# Patient Record
Sex: Male | Born: 1970 | Race: White | Hispanic: No | Marital: Married | Smoking: Never smoker
Health system: Southern US, Community
[De-identification: ages and names within clinical notes are randomized; demographics above are authoritative.]

## PROBLEM LIST (undated history)

## (undated) DIAGNOSIS — I10 Essential (primary) hypertension: Secondary | ICD-10-CM

## (undated) DIAGNOSIS — K219 Gastro-esophageal reflux disease without esophagitis: Secondary | ICD-10-CM

## (undated) HISTORY — PX: VASECTOMY: SHX75

---

## 2002-11-11 ENCOUNTER — Encounter: Admission: RE | Admit: 2002-11-11 | Discharge: 2002-11-11 | Payer: Self-pay | Admitting: Internal Medicine

## 2002-11-11 ENCOUNTER — Encounter: Payer: Self-pay | Admitting: Internal Medicine

## 2012-06-05 ENCOUNTER — Encounter: Payer: Self-pay | Admitting: Sports Medicine

## 2012-06-05 ENCOUNTER — Ambulatory Visit (INDEPENDENT_AMBULATORY_CARE_PROVIDER_SITE_OTHER): Payer: BC Managed Care – PPO | Admitting: Sports Medicine

## 2012-06-05 VITALS — BP 141/96 | HR 72 | Ht 77.0 in | Wt 265.0 lb

## 2012-06-05 DIAGNOSIS — M7710 Lateral epicondylitis, unspecified elbow: Secondary | ICD-10-CM | POA: Insufficient documentation

## 2012-06-05 DIAGNOSIS — M25529 Pain in unspecified elbow: Secondary | ICD-10-CM

## 2012-06-05 DIAGNOSIS — M25521 Pain in right elbow: Secondary | ICD-10-CM | POA: Insufficient documentation

## 2012-06-05 MED ORDER — NITROGLYCERIN 0.2 MG/HR TD PT24: MEDICATED_PATCH | TRANSDERMAL | Status: DC

## 2012-06-05 NOTE — Progress Notes (Signed)
  Subjective:    Patient ID: Jermaine Weber, male    DOB: 07-16-1970, 42 y.o.   MRN: [id number]  HPI  1. Right elbow pain. 42 yo male notes some left elbow pain began 3 months ago. He states had just finished building a patio deck and lifting 40-80 lb stones successfully without any problems. Soon after this, he was getting into his car to turn the ignition and felt his right elbow lock. He then forcefully turned the right elbow to start the car, and felt a sharp pain in the lateral elbow. Has noticed the pain intermittently since that time, present with supination of the arm and trying to grip objects.   He denies any swelling, bruising, weakness or history of elbow problems. He is using NSAIDs and resting with mild improvement.   Review of Systems See HPI otherwise negative.  reports that he has never smoked. He has never used smokeless tobacco.     Objective:   Physical Exam  Constitutional: He is oriented to person, place, and time. He appears well-developed. No distress.  HENT:  Head: Normocephalic and atraumatic.  Musculoskeletal: He exhibits no edema and no tenderness.  No significant right elbow bruising or effusion. Mild TTP at lateral epicondyle. ROM is intact. Has some mild pain with resisted supination and hand flexion. Pain with book grip test. 5/5 strength with supination and forearm flexion.   Neurological: He is alert and oriented to person, place, and time.   MSK [place] of right elbow: lateral epicondyle intact, there is some extrusion of the radial meniscus. Tendons appear to have hypoechoic change on transverse scan and slight hypoechoic change on longitudinal scan. There is also abnormal Doppler flow with neo-vessels      Assessment & Plan:

## 2012-06-05 NOTE — Patient Instructions (Addendum)
Radial meniscal injury with tendinopathy. Use nitro patch (1/4 cut patch) on lateral epicondyle daily. Wrist extension and supination exercises. Over slow and back fast.  Ball squeeze exercises.  Try elbow compression sleeve.  Follow up in clinic in 6 weeks.     Nitroglycerin Protocol   Apply 1/4 nitroglycerin patch to affected area daily.  Change position of patch within the affected area every 24 hours.  You may experience a headache during the first 1-2 weeks of using the patch, these should subside.  If you experience headaches after beginning nitroglycerin patch treatment, you may take your preferred over the counter pain reliever.  Another side effect of the nitroglycerin patch is skin irritation or rash related to patch adhesive.  Please notify our office if you develop more severe headaches or rash, and stop the patch.  Tendon healing with nitroglycerin patch may require 12 to 24 weeks depending on the extent of injury.  Men should not use if taking Viagra, Cialis, or Levitra.   Do not use if you have migraines or rosacea.

## 2012-06-05 NOTE — Assessment & Plan Note (Addendum)
US shows a radial meniscal injury which seems to fit with history of locking and popping sensation with supination. Will treat with some ROM and strengthening exercises for forearm. Compression sleeve recommended. Topical nitro patch at lateral epicondyle daily. Follow up in 6 weeks.

## 2012-06-05 NOTE — Assessment & Plan Note (Signed)
Tennis elbow exercise series  Nitroglycerin protocol for tendinopathy  Recheck 6 weeks

## 2012-07-18 ENCOUNTER — Encounter: Payer: Self-pay | Admitting: Sports Medicine

## 2012-07-18 ENCOUNTER — Ambulatory Visit (INDEPENDENT_AMBULATORY_CARE_PROVIDER_SITE_OTHER): Payer: BC Managed Care – PPO | Admitting: Sports Medicine

## 2012-07-18 VITALS — BP 123/84 | HR 75 | Ht 77.0 in | Wt 265.0 lb

## 2012-07-18 DIAGNOSIS — M7710 Lateral epicondylitis, unspecified elbow: Secondary | ICD-10-CM

## 2012-07-18 NOTE — Progress Notes (Signed)
Patient ID: Jermaine Weber, male   DOB: Apr 11, 1970, 42 y.o.   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  He remembers turning car key that was stuck and had sudden pain At that time was working on patio stones that were heavy  Now 75% less pain Uses compression when active He felt improvement fairly quickly once starting nitroglycerin  Prior to that he had had approximately 2 months of symptoms  Physical examination No acute distress Full range of motion right elbow  Minimal tenderness to palpation No pain with resisted wrist or finger extension Book test causes only slight pain  Musculoskeletal ultrasound Hypoechoic changes at the elbow or much less There is some slight hypoechoic change seen primarily on transverse view No abnormal calcifications No tears Neo-vessel activity noted before has now resolved

## 2012-07-18 NOTE — Assessment & Plan Note (Signed)
He is much improved  Continue exercises preferably daily for the next 6 weeks Continue nitroglycerin  Use compression sleeve primarily for sports  If this is 90% better by 6 weeks he will not need to return

## 2016-06-23 ENCOUNTER — Ambulatory Visit (INDEPENDENT_AMBULATORY_CARE_PROVIDER_SITE_OTHER): Payer: Managed Care, Other (non HMO) | Admitting: Sports Medicine

## 2016-06-23 ENCOUNTER — Ambulatory Visit: Payer: Self-pay

## 2016-06-23 ENCOUNTER — Encounter: Payer: Self-pay | Admitting: Sports Medicine

## 2016-06-23 VITALS — BP 138/88 | Ht 77.0 in | Wt 270.0 lb

## 2016-06-23 DIAGNOSIS — M25512 Pain in left shoulder: Secondary | ICD-10-CM

## 2016-06-24 DIAGNOSIS — M25512 Pain in left shoulder: Secondary | ICD-10-CM | POA: Insufficient documentation

## 2016-06-24 NOTE — Progress Notes (Signed)
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 Palpation is normal with no tenderness over AC joint or bicipital groove. ROM is full in all planes but painful on back scratch Rotator cuff strength normal throughout. No signs of impingement with negative Neer and Hawkin's tests empty can sign - weak on left Speeds and Yergason's tests normal. Possible labral pathology noted with Obrien's,  negative clunk and good stability. Normal scapular function observed. No painful arc and no drop arm sign. No apprehension sign  Ultrasound of left shoulder  Supraspinatus tendon normal Infraspinatus and teres minor normal Subscapularis normal No impingement dynamically Normal biceps tendon Normal AC joint  Impression is normal ultrasound of left shoulder  Ultrasound and interpretation by Sibyl ParrKarl B. Darrick PennaFields, MD

## 2016-06-24 NOTE — Assessment & Plan Note (Signed)
Start some HEP for Rotator cuff Can try Ntg if desired - helped him with elbow  Suspect this will heal with time  Reck if still painful in 6 wks

## 2016-11-15 ENCOUNTER — Other Ambulatory Visit: Payer: Self-pay | Admitting: Internal Medicine

## 2016-11-15 DIAGNOSIS — E041 Nontoxic single thyroid nodule: Secondary | ICD-10-CM | POA: Diagnosis not present

## 2016-11-15 DIAGNOSIS — E049 Nontoxic goiter, unspecified: Secondary | ICD-10-CM

## 2016-11-23 ENCOUNTER — Ambulatory Visit
Admission: RE | Admit: 2016-11-23 | Discharge: 2016-11-23 | Disposition: A | Discharge status: Home / Self Care | Payer: Managed Care, Other (non HMO) | Source: Ambulatory Visit | Attending: Internal Medicine | Admitting: Internal Medicine

## 2016-11-23 DIAGNOSIS — E041 Nontoxic single thyroid nodule: Secondary | ICD-10-CM | POA: Diagnosis not present

## 2016-11-23 DIAGNOSIS — E049 Nontoxic goiter, unspecified: Secondary | ICD-10-CM

## 2016-11-29 ENCOUNTER — Other Ambulatory Visit: Payer: Self-pay | Admitting: Internal Medicine

## 2016-11-29 DIAGNOSIS — E041 Nontoxic single thyroid nodule: Secondary | ICD-10-CM

## 2016-11-30 ENCOUNTER — Other Ambulatory Visit (HOSPITAL_COMMUNITY)
Admission: RE | Admit: 2016-11-30 | Discharge: 2016-11-30 | Disposition: A | Discharge status: Home / Self Care | Payer: 59 | Source: Ambulatory Visit | Attending: Interventional Radiology | Admitting: Interventional Radiology

## 2016-11-30 ENCOUNTER — Ambulatory Visit
Admission: RE | Admit: 2016-11-30 | Discharge: 2016-11-30 | Disposition: A | Discharge status: Home / Self Care | Payer: 59 | Source: Ambulatory Visit | Attending: Internal Medicine | Admitting: Internal Medicine

## 2016-11-30 DIAGNOSIS — E041 Nontoxic single thyroid nodule: Secondary | ICD-10-CM | POA: Insufficient documentation

## 2017-02-03 DIAGNOSIS — E041 Nontoxic single thyroid nodule: Secondary | ICD-10-CM | POA: Diagnosis not present

## 2017-02-03 DIAGNOSIS — R7301 Impaired fasting glucose: Secondary | ICD-10-CM | POA: Diagnosis not present

## 2017-02-03 DIAGNOSIS — I119 Hypertensive heart disease without heart failure: Secondary | ICD-10-CM | POA: Diagnosis not present

## 2017-04-28 DIAGNOSIS — E041 Nontoxic single thyroid nodule: Secondary | ICD-10-CM | POA: Diagnosis not present

## 2017-04-28 DIAGNOSIS — I119 Hypertensive heart disease without heart failure: Secondary | ICD-10-CM | POA: Diagnosis not present

## 2017-08-15 DIAGNOSIS — R7301 Impaired fasting glucose: Secondary | ICD-10-CM | POA: Diagnosis not present

## 2017-08-15 DIAGNOSIS — Z6832 Body mass index (BMI) 32.0-32.9, adult: Secondary | ICD-10-CM | POA: Diagnosis not present

## 2017-08-15 DIAGNOSIS — I119 Hypertensive heart disease without heart failure: Secondary | ICD-10-CM | POA: Diagnosis not present

## 2017-08-15 DIAGNOSIS — Z0000 Encounter for general adult medical examination without abnormal findings: Secondary | ICD-10-CM | POA: Diagnosis not present

## 2017-08-16 DIAGNOSIS — Z0000 Encounter for general adult medical examination without abnormal findings: Secondary | ICD-10-CM | POA: Diagnosis not present

## 2017-08-16 DIAGNOSIS — I119 Hypertensive heart disease without heart failure: Secondary | ICD-10-CM | POA: Diagnosis not present

## 2017-08-16 DIAGNOSIS — R7301 Impaired fasting glucose: Secondary | ICD-10-CM | POA: Diagnosis not present

## 2017-08-16 DIAGNOSIS — Z6832 Body mass index (BMI) 32.0-32.9, adult: Secondary | ICD-10-CM | POA: Diagnosis not present

## 2017-08-16 DIAGNOSIS — E041 Nontoxic single thyroid nodule: Secondary | ICD-10-CM | POA: Diagnosis not present

## 2018-01-26 DIAGNOSIS — D2360 Other benign neoplasm of skin of unspecified upper limb, including shoulder: Secondary | ICD-10-CM | POA: Diagnosis not present

## 2018-01-26 DIAGNOSIS — I119 Hypertensive heart disease without heart failure: Secondary | ICD-10-CM | POA: Diagnosis not present

## 2018-01-26 DIAGNOSIS — E041 Nontoxic single thyroid nodule: Secondary | ICD-10-CM | POA: Diagnosis not present

## 2018-01-26 DIAGNOSIS — R7301 Impaired fasting glucose: Secondary | ICD-10-CM | POA: Diagnosis not present

## 2018-04-11 DIAGNOSIS — Q828 Other specified congenital malformations of skin: Secondary | ICD-10-CM | POA: Diagnosis not present

## 2018-04-11 DIAGNOSIS — D2261 Melanocytic nevi of right upper limb, including shoulder: Secondary | ICD-10-CM | POA: Diagnosis not present

## 2018-04-12 ENCOUNTER — Other Ambulatory Visit: Payer: Self-pay | Admitting: Orthotics

## 2018-04-16 ENCOUNTER — Ambulatory Visit (INDEPENDENT_AMBULATORY_CARE_PROVIDER_SITE_OTHER): Payer: 59 | Admitting: Orthotics

## 2018-04-16 ENCOUNTER — Other Ambulatory Visit: Payer: Self-pay

## 2018-04-16 VITALS — Temp 98.6°F

## 2018-04-16 DIAGNOSIS — Q6652 Congenital pes planus, left foot: Secondary | ICD-10-CM | POA: Diagnosis not present

## 2018-04-16 DIAGNOSIS — Q6651 Congenital pes planus, right foot: Secondary | ICD-10-CM | POA: Diagnosis not present

## 2018-04-16 DIAGNOSIS — Q6650 Congenital pes planus, unspecified foot: Secondary | ICD-10-CM

## 2018-04-16 NOTE — Progress Notes (Signed)

## 2018-05-08 ENCOUNTER — Other Ambulatory Visit: Payer: Self-pay

## 2018-05-08 ENCOUNTER — Ambulatory Visit: Payer: 59 | Admitting: Orthotics

## 2018-05-08 VITALS — Temp 97.1°F

## 2018-05-08 DIAGNOSIS — Q6650 Congenital pes planus, unspecified foot: Secondary | ICD-10-CM

## 2018-05-08 NOTE — Progress Notes (Signed)
Patient came in today to pick up custom made foot orthotics.  The goals were accomplished and the patient reported no dissatisfaction with said orthotics.  Patient was advised of breakin period and how to report any issues. 

## 2018-07-27 ENCOUNTER — Telehealth: Payer: Self-pay | Admitting: Podiatry

## 2018-07-27 DIAGNOSIS — Q6652 Congenital pes planus, left foot: Secondary | ICD-10-CM

## 2018-07-27 DIAGNOSIS — Q6651 Congenital pes planus, right foot: Secondary | ICD-10-CM

## 2018-07-27 NOTE — Telephone Encounter (Signed)
Left message wanting to get a second pair of orthotics ordered.  I called pt back and he wants to order a second pair of orthotics same as last and is aware of the 199.00 cost. I told pt Liliane Channel was out of the office today and I would have him order them next Monday and  I would call when they come in.

## 2018-08-07 IMAGING — US US THYROID BIOPSY
1 series · 12 of 12 positions shown · non-contrast
Comparison: 11/23/2016

MEDICATIONS:
Lidocaine 1% subcutaneous

COMPLICATIONS:
None immediate.

INDICATION: Indeterminate thyroid nodule

EXAM:
ULTRASOUND GUIDED FINE NEEDLE ASPIRATION OF INDETERMINATE THYROID
NODULE
TECHNIQUE: Informed written consent was obtained from the patient after a
discussion of the risks, benefits and alternatives to treatment.
Questions regarding the procedure were encouraged and answered. A
timeout was performed prior to the initiation of the procedure.

[Series 1: us thyroid biopsy · 0.08mm/px · 12 acquisitions, 12 frames shown]
[im 1/12]
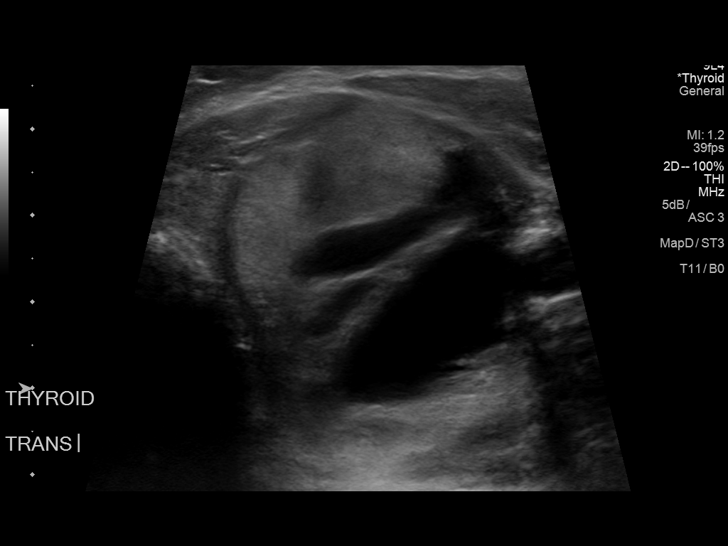
[im 2/12]
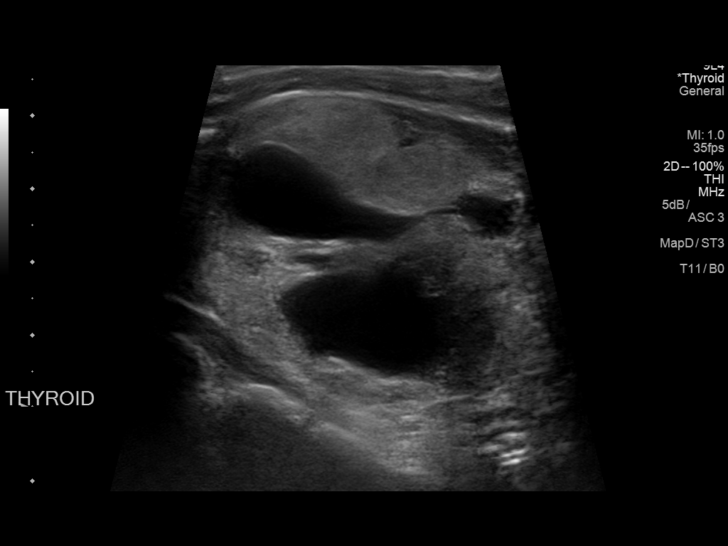
[im 3/12]
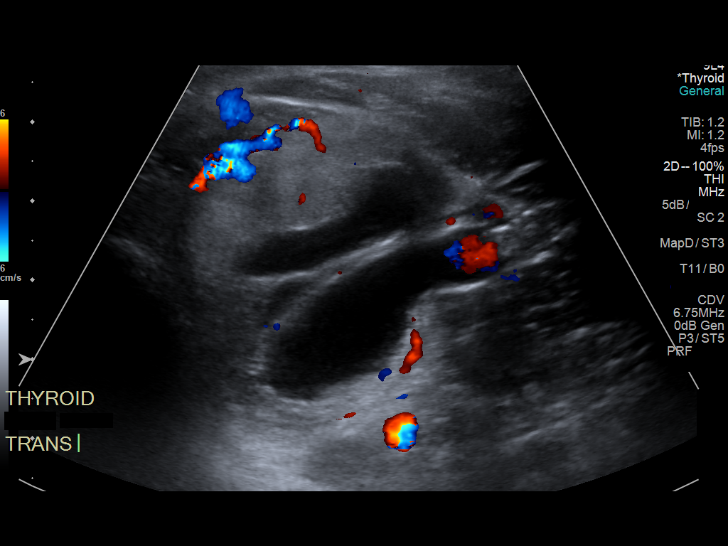
[im 4/12]
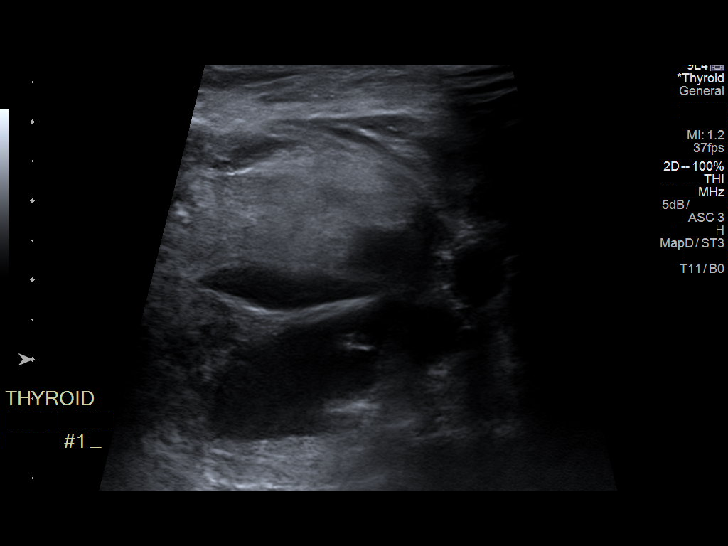
[im 5/12]
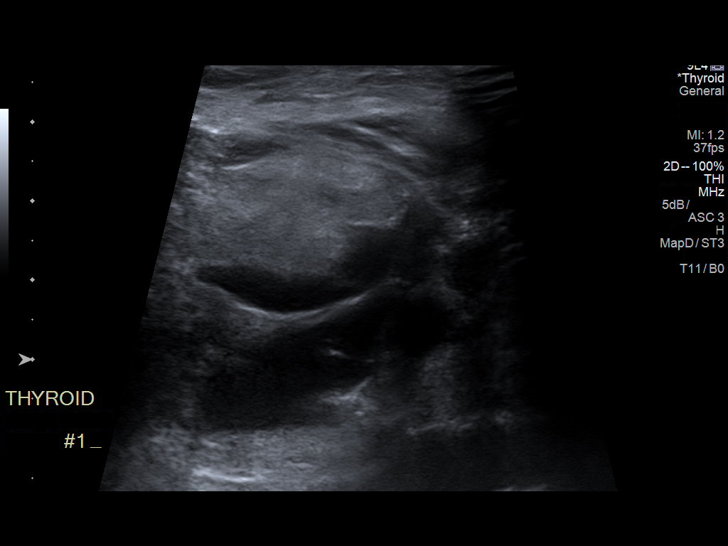
[im 6/12]
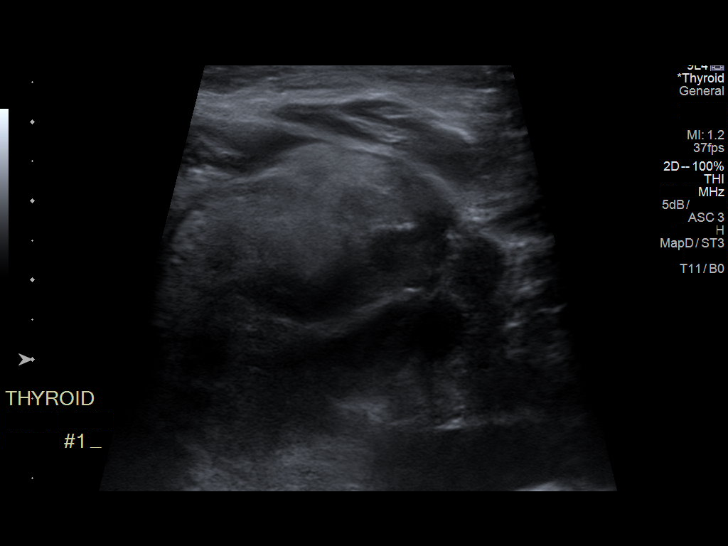
[im 7/12]
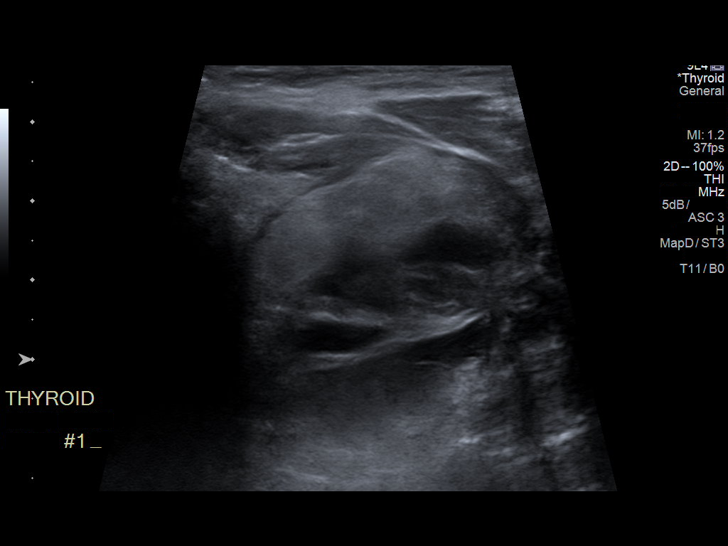
[im 8/12]
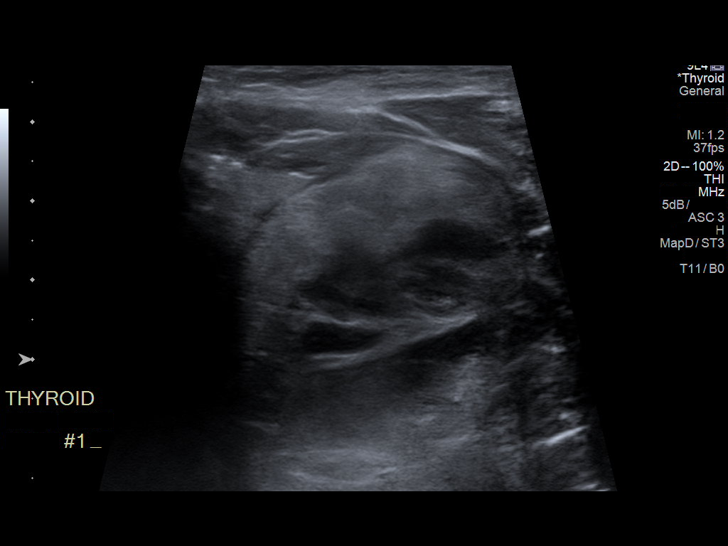
[im 9/12]
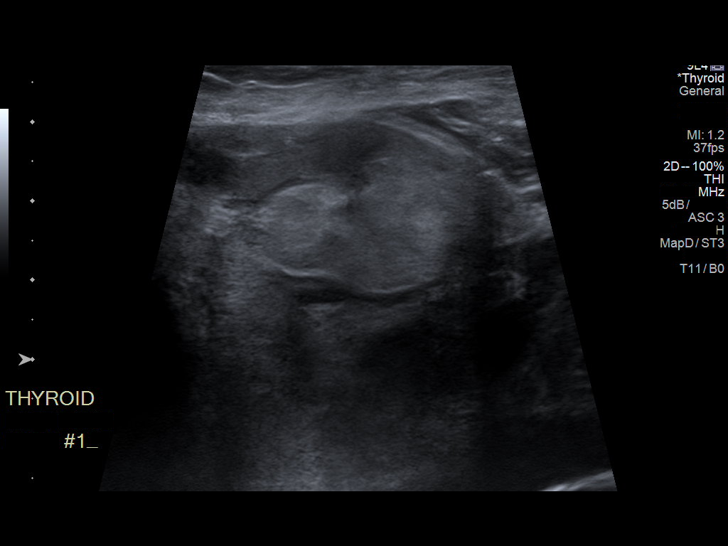
[im 10/12]
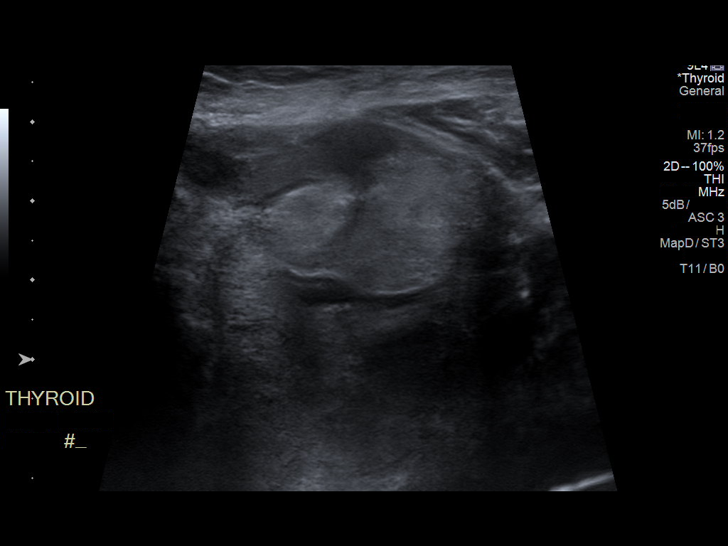
[im 11/12]
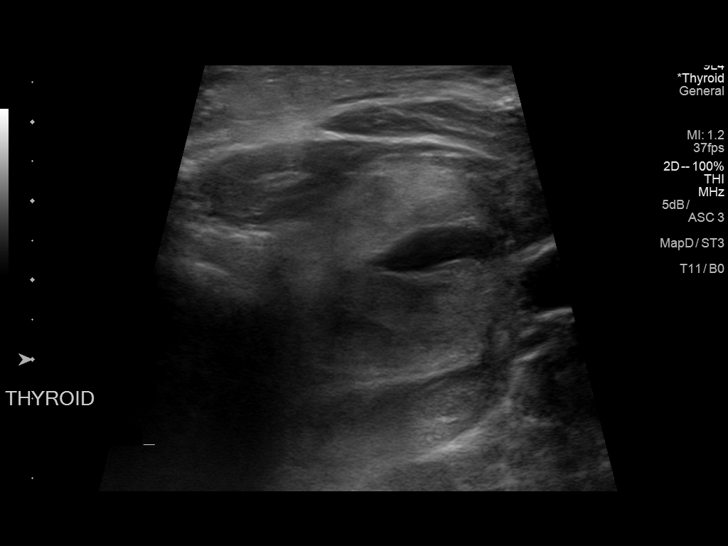
[im 12/12]
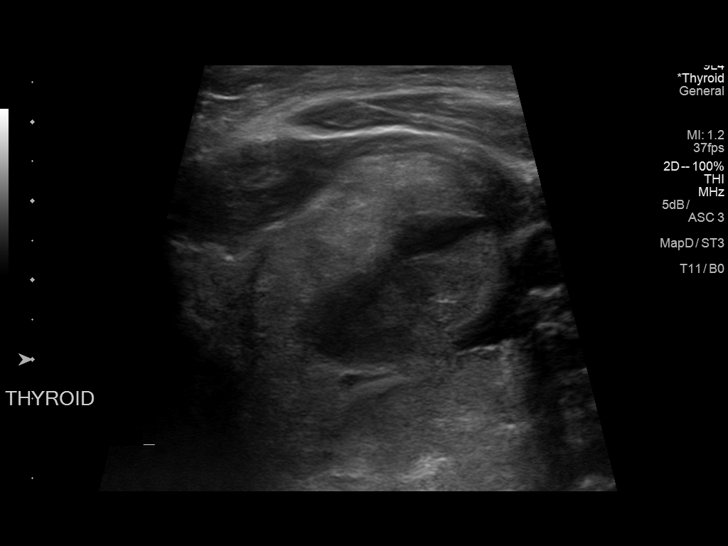

[12 of 12 positions shown; findings below may reference images not displayed]

Pre-procedural ultrasound scanning demonstrated unchanged size and
appearance of the indeterminate nodule within the inferior left
lobe.

The procedure was planned. The neck was prepped in the usual sterile
fashion, and a sterile drape was applied covering the operative
field. A timeout was performed prior to the initiation of the
procedure. Local anesthesia was provided with 1% lidocaine.

Under direct ultrasound guidance, 4 FNA biopsies were performed of
the left lower lobe complex solid/ cystic nodule with 25 gauge
needles, sampling solid regions and aspirating approximately 6 mL of
the cystic component for cytology. Multiple ultrasound images were
saved for procedural documentation purposes. The samples were
prepared and submitted to pathology.

Limited post procedural scanning was negative for hematoma or
additional complication. Dressings were placed. The patient
tolerated the above procedures procedure well without immediate
postprocedural complication.
FINDINGS: Nodule reference number based on prior diagnostic ultrasound: 2

Maximum size: 5.4 cm

Location: Left; Mid

ACR TI-RADS risk category: TR4 (4-6 points)

Reason for biopsy: meets ACR TI-RADS criteria

Ultrasound imaging confirms appropriate placement of the needles
within the thyroid nodule.
IMPRESSION: Technically successful ultrasound guided fine needle aspiration of
mid left 5.4 cm mixed solid/ cystic nodule.

## 2018-08-17 DIAGNOSIS — Z0000 Encounter for general adult medical examination without abnormal findings: Secondary | ICD-10-CM | POA: Diagnosis not present

## 2018-08-17 DIAGNOSIS — R7301 Impaired fasting glucose: Secondary | ICD-10-CM | POA: Diagnosis not present

## 2018-08-17 DIAGNOSIS — I119 Hypertensive heart disease without heart failure: Secondary | ICD-10-CM | POA: Diagnosis not present

## 2018-08-17 DIAGNOSIS — Z6832 Body mass index (BMI) 32.0-32.9, adult: Secondary | ICD-10-CM | POA: Diagnosis not present

## 2018-08-17 DIAGNOSIS — R351 Nocturia: Secondary | ICD-10-CM | POA: Diagnosis not present

## 2018-08-17 DIAGNOSIS — E041 Nontoxic single thyroid nodule: Secondary | ICD-10-CM | POA: Diagnosis not present

## 2018-08-17 DIAGNOSIS — Z125 Encounter for screening for malignant neoplasm of prostate: Secondary | ICD-10-CM | POA: Diagnosis not present

## 2018-08-27 ENCOUNTER — Ambulatory Visit: Payer: 59 | Admitting: Orthotics

## 2018-08-27 ENCOUNTER — Other Ambulatory Visit: Payer: Self-pay

## 2018-08-27 DIAGNOSIS — Q6650 Congenital pes planus, unspecified foot: Secondary | ICD-10-CM

## 2018-08-27 NOTE — Progress Notes (Signed)
Trimmed f/o 

## 2018-09-05 ENCOUNTER — Other Ambulatory Visit: Payer: 59 | Admitting: Orthotics

## 2019-02-15 DIAGNOSIS — R7301 Impaired fasting glucose: Secondary | ICD-10-CM | POA: Diagnosis not present

## 2019-02-15 DIAGNOSIS — I119 Hypertensive heart disease without heart failure: Secondary | ICD-10-CM | POA: Diagnosis not present

## 2019-02-15 DIAGNOSIS — E041 Nontoxic single thyroid nodule: Secondary | ICD-10-CM | POA: Diagnosis not present

## 2019-04-22 DIAGNOSIS — Z20828 Contact with and (suspected) exposure to other viral communicable diseases: Secondary | ICD-10-CM | POA: Diagnosis not present

## 2019-04-30 DIAGNOSIS — Z20828 Contact with and (suspected) exposure to other viral communicable diseases: Secondary | ICD-10-CM | POA: Diagnosis not present

## 2019-04-30 DIAGNOSIS — Z20822 Contact with and (suspected) exposure to covid-19: Secondary | ICD-10-CM | POA: Diagnosis not present

## 2019-05-21 ENCOUNTER — Other Ambulatory Visit: Payer: Self-pay

## 2019-05-21 NOTE — Patient Outreach (Signed)
[facility]) Care Management  05/21/2019  Kern Reap, MD 06-01-1970 [id number]   New referral: Referral source: insurance company  Reviewed KPN: Reviewed Epic  Placed call to patient with no answer. Identified machine. Left a message requesting a call back.  PLAN: will mail outreach letter and call back in 3 days.  Rowe Pavy, RN, BSN, CEN [facility] [employer] [phone]

## 2019-05-24 ENCOUNTER — Other Ambulatory Visit: Payer: Self-pay

## 2019-05-24 NOTE — Patient Outreach (Signed)
[facility]) Care Management  05/24/2019  Kern Reap, MD 24-Jul-1970 [id number]   Hypertension Initiative: Placed call to patient, a male answered and reports patient is not home to call back in the evenings.  PLAN: will attempt an evening outreach in the next 3 business days.  Rowe Pavy, RN, BSN, CEN [facility] [employer] [phone]

## 2019-05-28 ENCOUNTER — Ambulatory Visit: Payer: Self-pay

## 2019-05-29 ENCOUNTER — Other Ambulatory Visit: Payer: Self-pay

## 2019-05-29 NOTE — Patient Outreach (Signed)
[facility]) Care Management  05/29/2019  Kern Reap, MD 01/19/1970 [id number]     Placed 3rd call to patient with no answer. Left a message. Immediate call back, Spoke with patient who identified himself.  Reports he is doing well. Reviewed the reason for my call. 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 Offered to mail a BP cuff and educational materials. Patient respectfully declined.  PLAN: Patient not interested in participating. Has own way of monitoring his BP. Will close case as not interested.  Rowe Pavy, RN, BSN, CEN [facility] [employer] [phone]

## 2019-07-23 DIAGNOSIS — E669 Obesity, unspecified: Secondary | ICD-10-CM | POA: Diagnosis not present

## 2019-07-23 DIAGNOSIS — K219 Gastro-esophageal reflux disease without esophagitis: Secondary | ICD-10-CM | POA: Diagnosis not present

## 2019-07-23 DIAGNOSIS — Z1211 Encounter for screening for malignant neoplasm of colon: Secondary | ICD-10-CM | POA: Diagnosis not present

## 2019-07-23 DIAGNOSIS — Z800 Family history of malignant neoplasm of digestive organs: Secondary | ICD-10-CM | POA: Diagnosis not present

## 2019-08-02 DIAGNOSIS — Z1211 Encounter for screening for malignant neoplasm of colon: Secondary | ICD-10-CM | POA: Diagnosis not present

## 2019-08-02 DIAGNOSIS — Z800 Family history of malignant neoplasm of digestive organs: Secondary | ICD-10-CM | POA: Diagnosis not present

## 2019-08-02 DIAGNOSIS — D122 Benign neoplasm of ascending colon: Secondary | ICD-10-CM | POA: Diagnosis not present

## 2019-08-02 DIAGNOSIS — D125 Benign neoplasm of sigmoid colon: Secondary | ICD-10-CM | POA: Diagnosis not present

## 2019-08-02 DIAGNOSIS — K635 Polyp of colon: Secondary | ICD-10-CM | POA: Diagnosis not present

## 2019-08-09 DIAGNOSIS — R1032 Left lower quadrant pain: Secondary | ICD-10-CM | POA: Diagnosis not present

## 2019-08-09 DIAGNOSIS — Z0000 Encounter for general adult medical examination without abnormal findings: Secondary | ICD-10-CM | POA: Diagnosis not present

## 2019-08-09 DIAGNOSIS — Z1389 Encounter for screening for other disorder: Secondary | ICD-10-CM | POA: Diagnosis not present

## 2019-08-09 DIAGNOSIS — Z6832 Body mass index (BMI) 32.0-32.9, adult: Secondary | ICD-10-CM | POA: Diagnosis not present

## 2019-08-09 DIAGNOSIS — Z1331 Encounter for screening for depression: Secondary | ICD-10-CM | POA: Diagnosis not present

## 2019-08-09 DIAGNOSIS — E7800 Pure hypercholesterolemia, unspecified: Secondary | ICD-10-CM | POA: Diagnosis not present

## 2019-08-09 DIAGNOSIS — E041 Nontoxic single thyroid nodule: Secondary | ICD-10-CM | POA: Diagnosis not present

## 2019-08-09 DIAGNOSIS — R7301 Impaired fasting glucose: Secondary | ICD-10-CM | POA: Diagnosis not present

## 2019-08-09 DIAGNOSIS — I119 Hypertensive heart disease without heart failure: Secondary | ICD-10-CM | POA: Diagnosis not present

## 2021-02-25 ENCOUNTER — Other Ambulatory Visit: Payer: Self-pay | Admitting: Internal Medicine

## 2021-02-25 DIAGNOSIS — E049 Nontoxic goiter, unspecified: Secondary | ICD-10-CM

## 2021-03-01 ENCOUNTER — Ambulatory Visit
Admission: RE | Admit: 2021-03-01 | Discharge: 2021-03-01 | Disposition: A | Discharge status: Home / Self Care | Payer: Managed Care, Other (non HMO) | Source: Ambulatory Visit | Attending: Internal Medicine | Admitting: Internal Medicine

## 2021-03-01 DIAGNOSIS — E049 Nontoxic goiter, unspecified: Secondary | ICD-10-CM

## 2021-03-10 ENCOUNTER — Ambulatory Visit: Payer: Self-pay | Admitting: Surgery

## 2021-03-10 NOTE — H&P (Signed)
REFERRING PHYSICIAN:  Ralene Ok, MD   PROVIDER:  Bri Wakeman Myra Rude, MD   DATE OF ENCOUNTER: 03/10/2021   Subjective    Chief Complaint: New Consultation (Thyroid goiter with compressive symptoms)       History of Present Illness:   Patient is referred by Dr. Ralene Ok for surgical evaluation and management of an enlarging thyroid goiter with compressive symptoms.  Patient is a physician in our community.  He has had an enlarging thyroid gland for at least 4 years.  Initial studies were performed in 2018 including an ultrasound and a fine-needle aspiration biopsy of the dominant nodule in the left thyroid lobe.  Cytopathology was benign, Bethesda category II.  Follow-up ultrasound performed on March 01, 2021 was a normal size right thyroid lobe measuring 5.9 x 1.7 x 1.4 cm and an enlarged left thyroid lobe measuring 8.3 x 5.3 x 6.0 cm.  There is a small subcentimeter nodule on the right.  The dominant nodule on the left measures 5.4 cm in greatest dimension and shows both cystic and solid components.  Patient has noted progressive globus sensation.  He notes some shortness of breath depending on the physical position of the head and neck.  He has had occasional dysphagia.  He presents today to discuss proceeding with thyroidectomy for management of multinodular thyroid goiter with compressive symptoms.  Patient has had no prior head or neck surgery.  He has never been on thyroid medication.  There is a history of multiple family members including his son who take thyroid hormone supplementation for Hashimoto's thyroiditis.  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     Review of Systems: A complete review of systems was obtained from the patient.  I have reviewed this information and discussed as appropriate with the patient.  See HPI as well for other ROS.   Review of Systems  Constitutional: Negative.   HENT:       Globus sensation  Eyes: Negative.    Respiratory: Negative.   Cardiovascular: Negative.   Gastrointestinal:       Mild dysphagia  Genitourinary: Negative.   Musculoskeletal: Negative.   Skin: Negative.   Neurological: Negative.   Endo/Heme/Allergies: Negative.   Psychiatric/Behavioral: Negative.         Medical History: Past Medical History      Past Medical History:  Diagnosis Date   Hypertension     Thyroid disease             Patient Active Problem List  Diagnosis   Multiple thyroid nodules   Enlarged thyroid      Past Surgical History  History reviewed. No pertinent surgical history.      Allergies  No Known Allergies           Current Outpatient Medications on File Prior to Visit  Medication Sig Dispense Refill   lisinopriL-hydrochlorothiazide (ZESTORETIC) 20-12.5 mg tablet TAKE ONE TABLET BY MOUTH ONCE DAILY IN THE MORNING.        No current facility-administered medications on file prior to visit.      Family History       Family History  Problem Relation Age of Onset   Obesity Mother     High blood pressure (Hypertension) Mother     Hyperlipidemia (Elevated cholesterol) Mother     Heart valve disease Mother     Diabetes Mother     Breast cancer Mother     Hyperlipidemia (Elevated cholesterol) Father  High blood pressure (Hypertension) Father     Colon cancer Father          Social History       Tobacco Use  Smoking Status Never  Smokeless Tobacco Never      Social History  Social History        Socioeconomic History   Marital status: Married  Tobacco Use   Smoking status: Never   Smokeless tobacco: Never  Vaping Use   Vaping Use: Never used  Substance and Sexual Activity   Alcohol use: Yes   Drug use: Never        Objective:         Vitals:    03/10/21 0926  BP: (!) 132/90  Pulse: (!) 123  Temp: 37.6 C (99.6 F)  SpO2: 98%  Weight: (!) 115.7 kg (255 lb)  Height: 195.6 cm (6\' 5" )    Body mass index is 30.24 kg/m.   Physical Exam     GENERAL APPEARANCE Development: normal Nutritional status: normal Gross deformities: none   SKIN Rash, lesions, ulcers: none Induration, erythema: none Nodules: none palpable   EYES Conjunctiva and lids: normal Pupils: equal and reactive Iris: normal bilaterally   EARS, NOSE, MOUTH, THROAT External ears: no lesion or deformity External nose: no lesion or deformity Hearing: grossly normal Due to Covid-19 pandemic, patient is wearing a mask.   NECK Symmetric: no Trachea: midline Thyroid: There is a dominant visible palpable mass in the left thyroid lobe measuring approximately 5 to 6 cm in diameter.  It is smooth and nontender.  It is mobile with swallowing.  It may extend slightly beneath the left clavicle.  The right thyroid lobe is without palpable abnormality.  There is no associated lymphadenopathy.   CHEST Respiratory effort: normal Retraction or accessory muscle use: no Breath sounds: normal bilaterally Rales, rhonchi, wheeze: none   CARDIOVASCULAR Auscultation: regular rhythm, normal rate Murmurs: none Pulses: radial pulse 2+ palpable Lower extremity edema: none   MUSCULOSKELETAL Station and gait: normal Digits and nails: no clubbing or cyanosis Muscle strength: grossly normal all extremities Range of motion: grossly normal all extremities Deformity: none   LYMPHATIC Cervical: none palpable Supraclavicular: none palpable   PSYCHIATRIC Oriented to person, place, and time: yes Mood and affect: normal for situation Judgment and insight: appropriate for situation       Assessment and Plan:     Multiple thyroid nodules   Enlarged thyroid     Patient is referred by Dr. Ralene Ok for consideration for total thyroidectomy for management of multinodular thyroid goiter.  Patient is provided with written literature to review at home.   Patient provided with a copy of "The Thyroid Book: Medical and Surgical Treatment of Thyroid Problems", published by  [employer], 16 pages.  Book reviewed and explained to patient during visit today.   Today we discussed the option of left thyroid lobectomy versus total thyroidectomy for management of thyroid goiter.  We discussed the risk and benefits of the procedure including the risk of recurrent laryngeal nerve injury and injury to parathyroid glands.  We discussed the size and location of the surgical incision.  We discussed the hospital stay to be anticipated.  We discussed his postoperative recovery and return to work and activities.  We discussed the need for lifelong thyroid hormone replacement with total thyroidectomy.  After discussion, the patient would prefer to proceed with total thyroidectomy.  We will make arrangements for his procedure at a time convenient for the patient  in the near future.   The risks and benefits of the procedure have been discussed at length with the patient.  The patient understands the proposed procedure, potential alternative treatments, and the course of recovery to be expected.  All of the patient's questions have been answered at this time.  The patient wishes to proceed with surgery.  Darnell Level, MD [facility] A [facility] practice Office: [phone]

## 2021-03-15 NOTE — Progress Notes (Addendum)
COVID TEST ON 03/29/2021.   COME THRU MAIN ENTRANCE AT [place].  HAVE A SEAT IN THE LOBBY ON THE RIGHT AS YOU COME THRU THE DOOR.  CALL [phone] AND LET THEM KNOW YOU ARE HERE FOR COVID TESTING.     Your procedure is scheduled on:    04/01/21.    Report to [facility] Main  Entrance   Report to admitting at 0645            AM DO NOT BRING INSURANCE CARD, PICTURE ID OR WALLET DAY OF SURGERY.      Call this number if you have problems the morning of surgery [phone]    REMEMBER: NO  SOLID FOODS , CANDY, GUM OR MINTS AFTER [place] .       Marland Kitchen CLEAR LIQUIDS UNTIL    0600AM             DAY OF SURGERY.      PLEASE FINISH ENSURE DRINK PER SURGEON ORDER  WHICH NEEDS TO BE COMPLETED AT   0600AM        MORNING OF SURGERY.       CLEAR LIQUID DIET   Foods Allowed      WATER BLACK COFFEE ( SUGAR [place], NO MILK, CREAM OR CREAMER) REGULAR AND DECAF  TEA ( SUGAR [place] NO MILK, CREAM, OR CREAMER) REGULAR AND DECAF  PLAIN JELLO ( NO RED)  FRUIT ICES ( NO RED, NO FRUIT PULP)  POPSICLES ( NO RED)  JUICE- APPLE, WHITE GRAPE AND WHITE CRANBERRY  SPORT DRINK LIKE GATORADE ( NO RED)  CLEAR BROTH ( VEGETABLE , CHICKEN OR BEEF)                                                                     BRUSH YOUR TEETH MORNING OF SURGERY AND RINSE YOUR MOUTH OUT, NO CHEWING GUM CANDY OR MINTS.     Take these medicines the morning of surgery with A SIP OF WATER:  NONE    DO NOT TAKE ANY DIABETIC MEDICATIONS DAY OF YOUR SURGERY                               You may not have any metal on your body including hair pins and              piercings  Do not wear jewelry, make-up, lotions, powders or perfumes, deodorant             Do not wear nail polish on your fingernails.              IF YOU ARE A MALE AND WANT TO SHAVE UNDER ARMS OR LEGS PRIOR TO SURGERY YOU MUST DO SO AT LEAST 48 HOURS PRIOR TO SURGERY.              Men may shave face and neck.   Do not bring valuables  to the hospital. [place].  Contacts, dentures or bridgework may not be worn into surgery.  Leave suitcase in the car. After surgery it may  be brought to your room.     Patients discharged the day of surgery will not be allowed to drive home. IF YOU ARE HAVING SURGERY AND GOING HOME THE SAME DAY, YOU MUST HAVE AN ADULT TO DRIVE YOU HOME AND BE WITH YOU FOR 24 HOURS. YOU MAY GO HOME BY TAXI OR UBER OR ORTHERWISE, BUT AN ADULT MUST ACCOMPANY YOU HOME AND STAY WITH YOU FOR 24 HOURS.                Please read over the following fact sheets you were given: _____________________________________________________________________  [facility] - Preparing for Surgery Before surgery, you can play an important role.  Because skin is not sterile, your skin needs to be as free of germs as possible.  You can reduce the number of germs on your skin by washing with CHG (chlorahexidine gluconate) soap before surgery.  CHG is an antiseptic cleaner which kills germs and bonds with the skin to continue killing germs even after washing. Please DO NOT use if you have an allergy to CHG or antibacterial soaps.  If your skin becomes reddened/irritated stop using the CHG and inform your nurse when you arrive at Short Stay. Do not shave (including legs and underarms) for at least 48 hours prior to the first CHG shower.  You may shave your face/neck. Please follow these instructions carefully:  1.  Shower with CHG Soap the night before surgery and the  morning of Surgery.  2.  If you choose to wash your hair, wash your hair first as usual with your  normal  shampoo.  3.  After you shampoo, rinse your hair and body thoroughly to remove the  shampoo.                           4.  Use CHG as you would any other liquid soap.  You can apply chg directly  to the skin and wash                       Gently with a scrungie or clean washcloth.  5.  Apply the CHG Soap to your body ONLY  FROM THE NECK DOWN.   Do not use on face/ open                           Wound or open sores. Avoid contact with eyes, ears mouth and genitals (private parts).                       Wash face,  Genitals (private parts) with your normal soap.             6.  Wash thoroughly, paying special attention to the area where your surgery  will be performed.  7.  Thoroughly rinse your body with warm water from the neck down.  8.  DO NOT shower/wash with your normal soap after using and rinsing off  the CHG Soap.                9.  Pat yourself dry with a clean towel.            10.  Wear clean pajamas.            11.  Place clean sheets on your bed the night of your first shower and do not  sleep with pets. Day  of Surgery : Do not apply any lotions/deodorants the morning of surgery.  Please wear clean clothes to the hospital/surgery center.  FAILURE TO FOLLOW THESE INSTRUCTIONS MAY RESULT IN THE CANCELLATION OF YOUR SURGERY PATIENT SIGNATURE_________________________________  NURSE SIGNATURE__________________________________  ________________________________________________________________________

## 2021-03-16 ENCOUNTER — Other Ambulatory Visit: Payer: Self-pay

## 2021-03-16 ENCOUNTER — Ambulatory Visit (HOSPITAL_COMMUNITY)
Admission: RE | Admit: 2021-03-16 | Discharge: 2021-03-16 | Disposition: A | Discharge status: Home / Self Care | Payer: Managed Care, Other (non HMO) | Source: Ambulatory Visit | Attending: Anesthesiology | Admitting: Anesthesiology

## 2021-03-16 ENCOUNTER — Encounter (HOSPITAL_COMMUNITY)
Admission: RE | Admit: 2021-03-16 | Discharge: 2021-03-16 | Disposition: A | Discharge status: Home / Self Care | Payer: Managed Care, Other (non HMO) | Source: Ambulatory Visit | Attending: Surgery | Admitting: Surgery

## 2021-03-16 ENCOUNTER — Encounter (HOSPITAL_COMMUNITY): Payer: Self-pay

## 2021-03-16 VITALS — BP 150/86 | HR 76 | Temp 98.4°F | Resp 16 | Ht 77.0 in | Wt 221.0 lb

## 2021-03-16 DIAGNOSIS — Z01818 Encounter for other preprocedural examination: Secondary | ICD-10-CM | POA: Insufficient documentation

## 2021-03-16 HISTORY — DX: Essential (primary) hypertension: I10

## 2021-03-16 HISTORY — DX: Gastro-esophageal reflux disease without esophagitis: K21.9

## 2021-03-16 LAB — CBC
HCT: 42.9 % (ref 39.0–52.0)
Hemoglobin: 14.8 g/dL (ref 13.0–17.0)
MCH: 31.5 pg (ref 26.0–34.0)
MCHC: 34.5 g/dL (ref 30.0–36.0)
MCV: 91.3 fL (ref 80.0–100.0)
Platelets: 273 10*3/uL (ref 150–400)
RBC: 4.7 MIL/uL (ref 4.22–5.81)
RDW: 12.3 % (ref 11.5–15.5)
WBC: 6.2 10*3/uL (ref 4.0–10.5)
nRBC: 0 % (ref 0.0–0.2)

## 2021-03-16 LAB — BASIC METABOLIC PANEL
Anion gap: 6 (ref 5–15)
BUN: 18 mg/dL (ref 6–20)
CO2: 26 mmol/L (ref 22–32)
Calcium: 9.3 mg/dL (ref 8.9–10.3)
Chloride: 105 mmol/L (ref 98–111)
Creatinine, Ser: 1.08 mg/dL (ref 0.61–1.24)
GFR, Estimated: >60 mL/min (ref >60)
Glucose, Bld: 126 mg/dL — ABNORMAL HIGH (ref 70–99)
Potassium: 4 mmol/L (ref 3.5–5.1)
Sodium: 137 mmol/L (ref 135–145)

## 2021-03-16 NOTE — Progress Notes (Addendum)
Anesthesia Review:  PCP: DR Ralene Ok  Cardiologist : none  Chest x-ray :03/16/21  EKG : 03/16/21  Echo : Stress test: Cardiac Cath :  Activity level: can do a flight of stairs without difficulty  Sleep Study/ CPAP : none  Fasting Blood Sugar :      / Checks Blood Sugar -- times a day:   Blood Thinner/ Instructions /Last Dose: ASA / Instructions/ Last Dose :   Covid test on 03/29/21 at 0945am

## 2021-03-28 ENCOUNTER — Encounter (HOSPITAL_COMMUNITY): Payer: Self-pay | Admitting: Surgery

## 2021-03-28 DIAGNOSIS — E049 Nontoxic goiter, unspecified: Secondary | ICD-10-CM | POA: Diagnosis present

## 2021-03-28 DIAGNOSIS — E042 Nontoxic multinodular goiter: Secondary | ICD-10-CM | POA: Diagnosis present

## 2021-03-29 ENCOUNTER — Other Ambulatory Visit: Payer: Self-pay

## 2021-03-29 ENCOUNTER — Encounter (HOSPITAL_COMMUNITY)
Admission: RE | Admit: 2021-03-29 | Discharge: 2021-03-29 | Disposition: A | Discharge status: Home / Self Care | Payer: Managed Care, Other (non HMO) | Source: Ambulatory Visit | Attending: Surgery | Admitting: Surgery

## 2021-03-29 DIAGNOSIS — Z20822 Contact with and (suspected) exposure to covid-19: Secondary | ICD-10-CM | POA: Insufficient documentation

## 2021-03-29 DIAGNOSIS — Z01812 Encounter for preprocedural laboratory examination: Secondary | ICD-10-CM | POA: Diagnosis present

## 2021-03-29 LAB — SARS CORONAVIRUS 2 (TAT 6-24 HRS): SARS Coronavirus 2: NEGATIVE

## 2021-04-01 ENCOUNTER — Ambulatory Visit (HOSPITAL_COMMUNITY): Payer: Managed Care, Other (non HMO) | Admitting: Physician Assistant

## 2021-04-01 ENCOUNTER — Other Ambulatory Visit: Payer: Self-pay

## 2021-04-01 ENCOUNTER — Ambulatory Visit (HOSPITAL_COMMUNITY)
Admission: RE | Admit: 2021-04-01 | Discharge: 2021-04-02 | Disposition: A | Discharge status: Home / Self Care | Payer: Managed Care, Other (non HMO) | Attending: Surgery | Admitting: Surgery

## 2021-04-01 ENCOUNTER — Ambulatory Visit (HOSPITAL_BASED_OUTPATIENT_CLINIC_OR_DEPARTMENT_OTHER): Payer: Managed Care, Other (non HMO) | Admitting: Certified Registered"

## 2021-04-01 ENCOUNTER — Encounter (HOSPITAL_COMMUNITY)
Admission: RE | Disposition: A | Discharge status: Home / Self Care | Payer: Self-pay | Source: Home / Self Care | Attending: Surgery

## 2021-04-01 ENCOUNTER — Encounter (HOSPITAL_COMMUNITY): Payer: Self-pay | Admitting: Surgery

## 2021-04-01 DIAGNOSIS — F458 Other somatoform disorders: Secondary | ICD-10-CM | POA: Insufficient documentation

## 2021-04-01 DIAGNOSIS — I10 Essential (primary) hypertension: Secondary | ICD-10-CM | POA: Insufficient documentation

## 2021-04-01 DIAGNOSIS — E042 Nontoxic multinodular goiter: Secondary | ICD-10-CM

## 2021-04-01 DIAGNOSIS — J398 Other specified diseases of upper respiratory tract: Secondary | ICD-10-CM

## 2021-04-01 DIAGNOSIS — E049 Nontoxic goiter, unspecified: Secondary | ICD-10-CM | POA: Diagnosis present

## 2021-04-01 HISTORY — PX: THYROIDECTOMY: SHX17

## 2021-04-01 SURGERY — THYROIDECTOMY
Anesthesia: General | Site: Neck

## 2021-04-01 MED ORDER — DEXAMETHASONE SODIUM PHOSPHATE 10 MG/ML IJ SOLN
INTRAMUSCULAR | Status: DC | PRN
  Administered 2021-04-01: 8 mg via INTRAVENOUS

## 2021-04-01 MED ORDER — ACETAMINOPHEN 325 MG PO TABS
650.0000 mg | ORAL_TABLET | Freq: Four times a day (QID) | ORAL | Status: DC | PRN
Start: 2021-04-01 — End: 2021-04-02

## 2021-04-01 MED ORDER — LISINOPRIL 20 MG PO TABS
20.0000 mg | ORAL_TABLET | Freq: Every day | ORAL | Status: DC
Start: 2021-04-02 — End: 2021-04-02
  Administered 2021-04-02: 20 mg via ORAL
  Filled 2021-04-01: qty 1

## 2021-04-01 MED ORDER — LIDOCAINE 2% (20 MG/ML) 5 ML SYRINGE
INTRAMUSCULAR | Status: DC | PRN
  Administered 2021-04-01: 100 mg via INTRAVENOUS

## 2021-04-01 MED ORDER — DEXMEDETOMIDINE (PRECEDEX) IN NS 20 MCG/5ML (4 MCG/ML) IV SYRINGE
PREFILLED_SYRINGE | INTRAVENOUS | Status: DC | PRN
  Administered 2021-04-01: 8 ug via INTRAVENOUS
  Administered 2021-04-01: 12 ug via INTRAVENOUS

## 2021-04-01 MED ORDER — ORAL CARE MOUTH RINSE: 15.0000 mL | Freq: Once | OROMUCOSAL | Status: AC

## 2021-04-01 MED ORDER — OXYCODONE HCL 5 MG PO TABS: 5.0000 mg | ORAL_TABLET | Freq: Once | ORAL | Status: DC | PRN

## 2021-04-01 MED ORDER — MIDAZOLAM HCL 2 MG/2ML IJ SOLN
INTRAMUSCULAR | Status: DC | PRN
Start: 2021-04-01 — End: 2021-04-01
  Administered 2021-04-01: 2 mg via INTRAVENOUS

## 2021-04-01 MED ORDER — PROPOFOL 10 MG/ML IV BOLUS
INTRAVENOUS | Status: AC
  Filled 2021-04-01: qty 20

## 2021-04-01 MED ORDER — OXYCODONE HCL 5 MG/5ML PO SOLN: 5.0000 mg | Freq: Once | ORAL | Status: DC | PRN

## 2021-04-01 MED ORDER — ONDANSETRON 4 MG PO TBDP: 4.0000 mg | ORAL_TABLET | Freq: Four times a day (QID) | ORAL | Status: DC | PRN

## 2021-04-01 MED ORDER — CALCIUM CARBONATE 1250 (500 CA) MG PO TABS
2.0000 | ORAL_TABLET | Freq: Three times a day (TID) | ORAL | Status: DC
Start: 2021-04-01 — End: 2021-04-02
  Administered 2021-04-01 – 2021-04-02 (×2): 1000 mg via ORAL
  Filled 2021-04-01: qty 1
  Filled 2021-04-01: qty 2
  Filled 2021-04-01 (×4): qty 1

## 2021-04-01 MED ORDER — PHENYLEPHRINE HCL (PRESSORS) 10 MG/ML IV SOLN
INTRAVENOUS | Status: AC
  Filled 2021-04-01: qty 1

## 2021-04-01 MED ORDER — PHENYLEPHRINE 40 MCG/ML (10ML) SYRINGE FOR IV PUSH (FOR BLOOD PRESSURE SUPPORT)
PREFILLED_SYRINGE | INTRAVENOUS | Status: DC | PRN
  Administered 2021-04-01: 40 ug via INTRAVENOUS
  Administered 2021-04-01: 80 ug via INTRAVENOUS
  Administered 2021-04-01: 40 ug via INTRAVENOUS
  Administered 2021-04-01: 60 ug via INTRAVENOUS
  Administered 2021-04-01 (×3): 80 ug via INTRAVENOUS

## 2021-04-01 MED ORDER — FENTANYL CITRATE (PF) 100 MCG/2ML IJ SOLN
INTRAMUSCULAR | Status: AC
  Filled 2021-04-01: qty 2

## 2021-04-01 MED ORDER — SODIUM CHLORIDE 0.45 % IV SOLN: INTRAVENOUS | Status: DC

## 2021-04-01 MED ORDER — SUCCINYLCHOLINE CHLORIDE 200 MG/10ML IV SOSY
PREFILLED_SYRINGE | INTRAVENOUS | Status: DC | PRN
  Administered 2021-04-01: 100 mg via INTRAVENOUS

## 2021-04-01 MED ORDER — HYDROCHLOROTHIAZIDE 12.5 MG PO TABS
12.5000 mg | ORAL_TABLET | Freq: Every day | ORAL | Status: DC
  Administered 2021-04-02: 12.5 mg via ORAL
  Filled 2021-04-01: qty 1

## 2021-04-01 MED ORDER — CHLORHEXIDINE GLUCONATE CLOTH 2 % EX PADS: 6.0000 | MEDICATED_PAD | Freq: Once | CUTANEOUS | Status: DC

## 2021-04-01 MED ORDER — OXYCODONE HCL 5 MG PO TABS
5.0000 mg | ORAL_TABLET | ORAL | Status: DC | PRN
  Administered 2021-04-01 – 2021-04-02 (×5): 10 mg via ORAL
  Filled 2021-04-01 (×5): qty 2

## 2021-04-01 MED ORDER — ONDANSETRON HCL 4 MG/2ML IJ SOLN
INTRAMUSCULAR | Status: DC | PRN
  Administered 2021-04-01: 4 mg via INTRAVENOUS

## 2021-04-01 MED ORDER — LISINOPRIL-HYDROCHLOROTHIAZIDE 20-12.5 MG PO TABS: 1.0000 | ORAL_TABLET | Freq: Every day | ORAL | Status: DC

## 2021-04-01 MED ORDER — FENTANYL CITRATE PF 50 MCG/ML IJ SOSY
25.0000 ug | PREFILLED_SYRINGE | INTRAMUSCULAR | Status: DC | PRN
  Administered 2021-04-01 (×2): 50 ug via INTRAVENOUS

## 2021-04-01 MED ORDER — SUGAMMADEX SODIUM 200 MG/2ML IV SOLN
INTRAVENOUS | Status: DC | PRN
  Administered 2021-04-01: 200 mg via INTRAVENOUS

## 2021-04-01 MED ORDER — FENTANYL CITRATE (PF) 250 MCG/5ML IJ SOLN
INTRAMUSCULAR | Status: DC | PRN
  Administered 2021-04-01: 25 ug via INTRAVENOUS
  Administered 2021-04-01: 50 ug via INTRAVENOUS
  Administered 2021-04-01: 25 ug via INTRAVENOUS
  Administered 2021-04-01 (×2): 50 ug via INTRAVENOUS

## 2021-04-01 MED ORDER — AMISULPRIDE (ANTIEMETIC) 5 MG/2ML IV SOLN: 10.0000 mg | Freq: Once | INTRAVENOUS | Status: DC | PRN

## 2021-04-01 MED ORDER — ONDANSETRON HCL 4 MG/2ML IJ SOLN: 4.0000 mg | Freq: Four times a day (QID) | INTRAMUSCULAR | Status: DC | PRN

## 2021-04-01 MED ORDER — ROCURONIUM BROMIDE 10 MG/ML (PF) SYRINGE
PREFILLED_SYRINGE | INTRAVENOUS | Status: DC | PRN
  Administered 2021-04-01 (×2): 25 mg via INTRAVENOUS
  Administered 2021-04-01: 45 mg via INTRAVENOUS
  Administered 2021-04-01: 5 mg via INTRAVENOUS

## 2021-04-01 MED ORDER — HEMOSTATIC AGENTS (NO CHARGE) OPTIME
TOPICAL | Status: DC | PRN
Start: 2021-04-01 — End: 2021-04-01
  Administered 2021-04-01: 1 via TOPICAL

## 2021-04-01 MED ORDER — CEFAZOLIN SODIUM-DEXTROSE 2-4 GM/100ML-% IV SOLN
2.0000 g | INTRAVENOUS | Status: AC
  Administered 2021-04-01: 2 g via INTRAVENOUS
  Filled 2021-04-01: qty 100

## 2021-04-01 MED ORDER — MIDAZOLAM HCL 2 MG/2ML IJ SOLN
INTRAMUSCULAR | Status: AC
  Filled 2021-04-01: qty 2

## 2021-04-01 MED ORDER — ROCURONIUM BROMIDE 10 MG/ML (PF) SYRINGE
PREFILLED_SYRINGE | INTRAVENOUS | Status: AC
  Filled 2021-04-01: qty 10

## 2021-04-01 MED ORDER — FENTANYL CITRATE PF 50 MCG/ML IJ SOSY
PREFILLED_SYRINGE | INTRAMUSCULAR | Status: AC
  Filled 2021-04-01: qty 2

## 2021-04-01 MED ORDER — ACETAMINOPHEN 650 MG RE SUPP: 650.0000 mg | Freq: Four times a day (QID) | RECTAL | Status: DC | PRN

## 2021-04-01 MED ORDER — CHLORHEXIDINE GLUCONATE 0.12 % MT SOLN
15.0000 mL | Freq: Once | OROMUCOSAL | Status: AC
  Administered 2021-04-01: 15 mL via OROMUCOSAL

## 2021-04-01 MED ORDER — LACTATED RINGERS IV SOLN: INTRAVENOUS | Status: DC

## 2021-04-01 MED ORDER — ONDANSETRON HCL 4 MG/2ML IJ SOLN: 4.0000 mg | Freq: Once | INTRAMUSCULAR | Status: DC | PRN

## 2021-04-01 MED ORDER — ACETAMINOPHEN 500 MG PO TABS
1000.0000 mg | ORAL_TABLET | Freq: Once | ORAL | Status: AC
  Administered 2021-04-01: 1000 mg via ORAL
  Filled 2021-04-01: qty 2

## 2021-04-01 MED ORDER — PROPOFOL 10 MG/ML IV BOLUS
INTRAVENOUS | Status: DC | PRN
  Administered 2021-04-01: 200 mg via INTRAVENOUS

## 2021-04-01 MED ORDER — DEXAMETHASONE SODIUM PHOSPHATE 10 MG/ML IJ SOLN
INTRAMUSCULAR | Status: AC
  Filled 2021-04-01: qty 1

## 2021-04-01 MED ORDER — TRAMADOL HCL 50 MG PO TABS: 50.0000 mg | ORAL_TABLET | Freq: Four times a day (QID) | ORAL | Status: DC | PRN

## 2021-04-01 MED ORDER — SUCCINYLCHOLINE CHLORIDE 200 MG/10ML IV SOSY
PREFILLED_SYRINGE | INTRAVENOUS | Status: AC
  Filled 2021-04-01: qty 10

## 2021-04-01 MED ORDER — ONDANSETRON HCL 4 MG/2ML IJ SOLN
INTRAMUSCULAR | Status: AC
  Filled 2021-04-01: qty 2

## 2021-04-01 MED ORDER — HYDROMORPHONE HCL 1 MG/ML IJ SOLN
1.0000 mg | INTRAMUSCULAR | Status: DC | PRN
  Administered 2021-04-01 (×2): 1 mg via INTRAVENOUS
  Filled 2021-04-01 (×2): qty 1

## 2021-04-01 MED ORDER — 0.9 % SODIUM CHLORIDE (POUR BTL) OPTIME
TOPICAL | Status: DC | PRN
  Administered 2021-04-01: 1000 mL

## 2021-04-01 SURGICAL SUPPLY — 37 items
ATTRACTOMAT 16X20 MAGNETIC DRP (DRAPES) ×2 IMPLANT
BAG COUNTER SPONGE SURGICOUNT (BAG) ×2 IMPLANT
BLADE SURG 15 STRL LF DISP TIS (BLADE) ×1 IMPLANT
BLADE SURG 15 STRL SS (BLADE) ×1
CHLORAPREP W/TINT 26 (MISCELLANEOUS) ×2 IMPLANT
CLIP TI MEDIUM 6 (CLIP) ×7 IMPLANT
CLIP TI WIDE RED SMALL 6 (CLIP) ×5 IMPLANT
COVER SURGICAL LIGHT HANDLE (MISCELLANEOUS) ×2 IMPLANT
DERMABOND ADVANCED (GAUZE/BANDAGES/DRESSINGS) ×1
DERMABOND ADVANCED .7 DNX12 (GAUZE/BANDAGES/DRESSINGS) ×1 IMPLANT
DRAPE LAPAROTOMY T 98X78 PEDS (DRAPES) ×2 IMPLANT
DRAPE UTILITY XL STRL (DRAPES) ×2 IMPLANT
ELECT PENCIL ROCKER SW 15FT (MISCELLANEOUS) ×2 IMPLANT
ELECT REM PT RETURN 15FT ADLT (MISCELLANEOUS) ×2 IMPLANT
GAUZE 4X4 16PLY ~~LOC~~+RFID DBL (SPONGE) ×2 IMPLANT
GLOVE SURG ENC MOIS LTX SZ7.5 (GLOVE) ×2 IMPLANT
GLOVE SURG SYN 7.0 (GLOVE) ×2 IMPLANT
GLOVE SURG SYN 7.0 PF PI (GLOVE) IMPLANT
GLOVE SURG SYN 7.5  E (GLOVE) ×2
GLOVE SURG SYN 7.5 E (GLOVE) ×2 IMPLANT
GLOVE SURG SYN 7.5 PF PI (GLOVE) ×2 IMPLANT
GLOVE SURG UNDER POLY LF SZ6.5 (GLOVE) ×2 IMPLANT
GOWN STRL REUS W/ TWL XL LVL3 (GOWN DISPOSABLE) ×2 IMPLANT
GOWN STRL REUS W/TWL LRG LVL3 (GOWN DISPOSABLE) ×2 IMPLANT
GOWN STRL REUS W/TWL XL LVL3 (GOWN DISPOSABLE) ×4
HEMOSTAT SURGICEL 2X4 FIBR (HEMOSTASIS) ×2 IMPLANT
ILLUMINATOR WAVEGUIDE N/F (MISCELLANEOUS) ×2 IMPLANT
KIT BASIN OR (CUSTOM PROCEDURE TRAY) ×2 IMPLANT
KIT TURNOVER KIT A (KITS) ×1 IMPLANT
PACK BASIC VI WITH GOWN DISP (CUSTOM PROCEDURE TRAY) ×2 IMPLANT
SHEARS HARMONIC 9CM CVD (BLADE) ×2 IMPLANT
SUT MNCRL AB 4-0 PS2 18 (SUTURE) ×2 IMPLANT
SUT VIC AB 3-0 SH 18 (SUTURE) ×4 IMPLANT
SYR BULB IRRIG 60ML STRL (SYRINGE) ×2 IMPLANT
TOWEL OR 17X26 10 PK STRL BLUE (TOWEL DISPOSABLE) ×2 IMPLANT
TOWEL OR NON WOVEN STRL DISP B (DISPOSABLE) ×2 IMPLANT
TUBING CONNECTING 10 (TUBING) ×2 IMPLANT

## 2021-04-01 NOTE — Anesthesia Preprocedure Evaluation (Signed)
Anesthesia Evaluation  ?Patient identified by MRN, date of birth, ID band ?Patient awake ? ? ? ?Reviewed: ?Allergy & Precautions, NPO status , Patient's Chart, lab work & pertinent test results ? ?History of Anesthesia Complications ?Negative for: history of anesthetic complications ? ?Airway ?Mallampati: I ? ?TM Distance: >3 FB ?Neck ROM: Full ? ? ? Dental ? ?(+) Dental Advisory Given, Teeth Intact ?  ?Pulmonary ?neg pulmonary ROS,  ?  ?Pulmonary exam normal ? ? ? ? ? ? ? Cardiovascular ?hypertension, Pt. on medications ?Normal cardiovascular exam ? ? ?  ?Neuro/Psych ?negative neurological ROS ?   ? GI/Hepatic ?Neg liver ROS, GERD  ,  ?Endo/Other  ?negative endocrine ROSMultinodular thyroid goiter ? Renal/GU ?negative Renal ROS  ?negative genitourinary ?  ?Musculoskeletal ?negative musculoskeletal ROS ?(+)  ? Abdominal ?  ?Peds ? Hematology ?negative hematology ROS ?(+)   ?Anesthesia Other Findings ? 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  ? Reproductive/Obstetrics ? ?  ? ? ? ? ? ? ? ? ? ? ? ? ? ?  ?  ? ? ? ? ? ? ? ? ?Anesthesia Physical ?Anesthesia Plan ? ?ASA: 2 ? ?Anesthesia Plan: General  ? ?Post-op Pain Management: Tylenol PO (pre-op)* and Toradol IV (intra-op)*  ? ?Induction: Intravenous ? ?PONV Risk Score and Plan: 2 and Ondansetron, Dexamethasone, Treatment may vary due to age or medical condition and Midazolam ? ?Airway Management Planned: Oral ETT ? ?Additional Equipment: None ? ?Intra-op Plan:  ? ?Post-operative Plan: Extubation in OR ? ?Informed Consent: I have reviewed the patients History and Physical, chart, labs and discussed the procedure including the risks, benefits and alternatives for the proposed anesthesia with the patient or authorized representative who has indicated his/her understanding and acceptance.  ? ? ? ?Dental advisory given ? ?Plan Discussed with:  ? ?Anesthesia Plan Comments:    ? ? ? ? ? ? ?Anesthesia Quick Evaluation ? ?

## 2021-04-01 NOTE — Transfer of Care (Signed)
Immediate Anesthesia Transfer of Care Note ? ?Patient: Kern Reap, MD ? ?Procedure(s) Performed: TOTAL THYROIDECTOMY (Neck) ? ?Patient Location: PACU ? ?Anesthesia Type:General ? ?Level of Consciousness: awake, drowsy and patient cooperative ? ?Airway & Oxygen Therapy: Patient Spontanous Breathing and Patient connected to face mask oxygen ? ?Post-op Assessment: Report given to RN and Post -op Vital signs reviewed and stable ? ?Post vital signs: Reviewed and stable ? ?Last Vitals:  ?Vitals Value Taken Time  ?BP 157/97 04/01/21 1103  ?Temp    ?Pulse 85 04/01/21 1106  ?Resp 14 04/01/21 1106  ?SpO2 96 % 04/01/21 1106  ?Vitals shown include unvalidated device data. ? ?Last Pain:  ?Vitals:  ? 04/01/21 0730  ?TempSrc: Oral  ?   ? ?  ? ?Complications: No notable events documented. ?

## 2021-04-01 NOTE — Interval H&P Note (Signed)
History and Physical Interval Note: ? ?04/01/2021 ?8:27 AM ? ?Kern Reap, MD  has presented today for surgery, with the diagnosis of MULTINODULAR THYROID GOITER.  The various methods of treatment have been discussed with the patient and family. After consideration of risks, benefits and other options for treatment, the patient has consented to  ? ? Procedure(s): ?TOTAL THYROIDECTOMY (N/A) as a surgical intervention.   ? ?The patient's history has been reviewed, patient examined, no change in status, stable for surgery.  I have reviewed the patient's chart and labs.  Questions were answered to the patient's satisfaction.   ? ?Jermaine Level, MD ?[facility] ?A [facility] practice ?Office: [phone] ? ? ?Jermaine Weber ? ? ?

## 2021-04-01 NOTE — Op Note (Signed)
Procedure Note ? ?Pre-operative Diagnosis:  Multiple thyroid nodules, enlarged thyroid, tracheal deviation ? ?Post-operative Diagnosis:  same ? ?Surgeon:  Armandina Gemma, MD ? ?Assistant:  none  ? ?Procedure:  Total thyroidectomy ? ?Anesthesia:  General ? ?Estimated Blood Loss:  minimal ? ?Drains: none ?        ?Specimen: thyroid to pathology ? ?Indications:  Patient is referred by Dr. Jilda Panda for surgical evaluation and management of an enlarging thyroid goiter with compressive symptoms.  Patient is a physician in our community.  He has had an enlarging thyroid gland for at least 4 years.  Initial studies were performed in 2018 including an ultrasound and a fine-needle aspiration biopsy of the dominant nodule in the left thyroid lobe.  Cytopathology was benign, Bethesda category II.  Follow-up ultrasound performed on March 01, 2021 was a normal size right thyroid lobe measuring 5.9 x 1.7 x 1.4 cm and an enlarged left thyroid lobe measuring 8.3 x 5.3 x 6.0 cm.  There is a small subcentimeter nodule on the right.  The dominant nodule on the left measures 5.4 cm in greatest dimension and shows both cystic and solid components.  Patient has noted progressive globus sensation.  He notes some shortness of breath depending on the physical position of the head and neck.  He has had occasional dysphagia.  He presents today to discuss proceeding with thyroidectomy for management of multinodular thyroid goiter with compressive symptoms.  Patient has had no prior head or neck surgery.  He has never been on thyroid medication.  There is a history of multiple family members including his son who take thyroid hormone supplementation for Hashimoto's thyroiditis.  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 ? ?Procedure Details: Procedure was done in OR #1 at the [facility]. The patient was brought to the operating room and placed in a supine position on the operating room table. Following administration  of general anesthesia, the patient was positioned and then prepped and draped in the usual aseptic fashion. After ascertaining that an adequate level of anesthesia had been achieved, a small Kocher incision was made with #15 blade. Dissection was carried through subcutaneous tissues and platysma.Hemostasis was achieved with the electrocautery. Skin flaps were elevated cephalad and caudad from the thyroid notch to the sternal notch. A Mahorner self-retaining retractor was placed for exposure. Strap muscles were incised in the midline and dissection was begun on the left side.  Strap muscles were reflected laterally.  Left thyroid lobe was markedly enlarged and multinodular.  The left lobe was gently mobilized with blunt dissection. Superior pole vessels were dissected out and divided individually between small and medium ligaclips with the harmonic scalpel. The thyroid lobe was rolled anteriorly. Branches of the inferior thyroid artery were divided between small ligaclips with the harmonic scalpel. Inferior venous tributaries were divided between ligaclips. Both the superior and inferior parathyroid glands were identified and preserved on their vascular pedicles. The recurrent laryngeal nerve was identified and preserved along its course. The ligament of Gwenlyn Found was released with the electrocautery and the gland was mobilized onto the anterior trachea. Isthmus was mobilized across the midline. There was no significant pyramidal lobe present. Dry pack was placed in the left neck. ? ?The right thyroid lobe was gently mobilized with blunt dissection. Right thyroid lobe was normal in size with small nodules. Superior pole vessels were dissected out and divided between small and medium ligaclips with the Harmonic scalpel. Superior parathyroid was identified and preserved. Inferior  venous tributaries were divided between medium ligaclips with the harmonic scalpel. The right thyroid lobe was rolled anteriorly and the branches  of the inferior thyroid artery divided between small ligaclips. The right recurrent laryngeal nerve was identified and preserved along its course. The ligament of Gwenlyn Found was released with the electrocautery. The right thyroid lobe was mobilized onto the anterior trachea and the remainder of the thyroid was dissected off the anterior trachea and the thyroid was completely excised. A suture was used to mark the right lobe. The entire thyroid gland was submitted to pathology for review. ? ?The neck was irrigated with warm saline. Fibrillar was placed throughout the operative field. Strap muscles were approximated in the midline with interrupted 3-0 Vicryl sutures. Platysma was closed with interrupted 3-0 Vicryl sutures. Skin was closed with a running 4-0 Monocryl subcuticular suture. Wound was washed and Dermabond was applied. The patient was awakened from anesthesia and brought to the recovery room. The patient tolerated the procedure well. ? ? ?Armandina Gemma, MD ?[facility] ?Office: [phone]  ?

## 2021-04-01 NOTE — Anesthesia Procedure Notes (Signed)
Procedure Name: Intubation ?Date/Time: 04/01/2021 8:54 AM ?Performed by: Sindy Guadeloupe, CRNA ?Pre-anesthesia Checklist: Patient identified, Emergency Drugs available, Suction available, Patient being monitored and Timeout performed ?Patient Re-evaluated:Patient Re-evaluated prior to induction ?Oxygen Delivery Method: Circle system utilized ?Preoxygenation: Pre-oxygenation with 100% oxygen ?Induction Type: IV induction ?Ventilation: Mask ventilation without difficulty ?Laryngoscope Size: Glidescope and 4 ?Grade View: Grade I ?Tube type: Oral ?Number of attempts: 1 ?Airway Equipment and Method: Stylet ?Placement Confirmation: ETT inserted through vocal cords under direct vision, positive ETCO2 and breath sounds checked- equal and bilateral ?Secured at: 23 cm ?Tube secured with: Tape ?Dental Injury: Teeth and Oropharynx as per pre-operative assessment  ?Comments: Started with Glidescope due to trachea deviation from thyroid mass ? ? ? ? ?

## 2021-04-01 NOTE — Anesthesia Postprocedure Evaluation (Signed)
Anesthesia Post Note ? ?Patient: Jermaine Reap, MD ? ?Procedure(s) Performed: TOTAL THYROIDECTOMY (Neck) ? ?  ? ?Patient location during evaluation: PACU ?Anesthesia Type: General ?Level of consciousness: awake and alert ?Pain management: pain level controlled ?Vital Signs Assessment: post-procedure vital signs reviewed and stable ?Respiratory status: spontaneous breathing, nonlabored ventilation and respiratory function stable ?Cardiovascular status: blood pressure returned to baseline and stable ?Postop Assessment: no apparent nausea or vomiting ?Anesthetic complications: no ? ? ?No notable events documented. ? ?Last Vitals:  ?Vitals:  ? 04/01/21 1205 04/01/21 1309  ?BP: (!) 169/99 (!) 191/93  ?Pulse: 79 76  ?Resp:    ?Temp: 36.9 ?C 36.6 ?C  ?SpO2: 93% 95%  ?  ?Last Pain:  ?Vitals:  ? 04/01/21 1309  ?TempSrc: Oral  ?PainSc:   ? ? ?  ?  ?  ?  ?  ?  ? ?Jermaine Weber ? ? ? ? ?

## 2021-04-02 ENCOUNTER — Encounter (HOSPITAL_COMMUNITY): Payer: Self-pay | Admitting: Surgery

## 2021-04-02 DIAGNOSIS — E042 Nontoxic multinodular goiter: Secondary | ICD-10-CM | POA: Diagnosis not present

## 2021-04-02 LAB — BASIC METABOLIC PANEL
Anion gap: 10 (ref 5–15)
BUN: 15 mg/dL (ref 6–20)
CO2: 25 mmol/L (ref 22–32)
Calcium: 8.9 mg/dL (ref 8.9–10.3)
Chloride: 100 mmol/L (ref 98–111)
Creatinine, Ser: 1 mg/dL (ref 0.61–1.24)
GFR, Estimated: >60 mL/min (ref >60)
Glucose, Bld: 135 mg/dL — ABNORMAL HIGH (ref 70–99)
Potassium: 4 mmol/L (ref 3.5–5.1)
Sodium: 135 mmol/L (ref 135–145)

## 2021-04-02 MED ORDER — LEVOTHYROXINE SODIUM 125 MCG PO TABS: 125.0000 ug | ORAL_TABLET | Freq: Every day | ORAL | 3 refills | Status: DC

## 2021-04-02 MED ORDER — OXYCODONE HCL 5 MG PO TABS: 5.0000 mg | ORAL_TABLET | Freq: Four times a day (QID) | ORAL | 0 refills | Status: AC | PRN

## 2021-04-02 MED ORDER — CALCIUM CARBONATE ANTACID 500 MG PO CHEW: 2.0000 | CHEWABLE_TABLET | Freq: Two times a day (BID) | ORAL | 1 refills | Status: AC

## 2021-04-02 MED ORDER — OXYCODONE HCL 5 MG PO TABS: 5.0000 mg | ORAL_TABLET | ORAL | 0 refills | Status: AC | PRN

## 2021-04-02 NOTE — Discharge Summary (Signed)
?  ?  Physician Discharge Summary  ? 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 ? ?Admit date: 04/01/2021 ? ?Discharge date: 04/02/2021 ? ?Discharge Diagnoses:  Principal Problem: ?  Multiple thyroid nodules ?Active Problems: ?  Enlarged thyroid ? ? ?Discharged Condition: good ? ?Hospital Course: Patient was admitted for observation following thyroid surgery.  Post op course was uncomplicated.  Pain was well controlled.  Tolerated diet.  Post op calcium level on morning following surgery was 8.9 mg/dl.  Patient was prepared for discharge home on POD#1. ? ?Consults: None ? ?Treatments: surgery: total thyroidectomy ? ?Discharge Exam: ?Blood pressure 138/73, pulse 72, temperature 97.8 ?F (36.6 ?C), temperature source Oral, resp. rate 18, height 6\' 5"  (1.956 m), weight 116.9 kg, SpO2 97 %. ?HEENT - clear ?Neck - wound dry and intact; mild STS; voice with slight hoarseness ? ?Disposition: Home ? ? ?Allergies as of 04/02/2021   ? ?   Reactions  ? Other Rash  ? Chlorhexidine Gluconate (2% CHG wipes)  ? ?  ? ?  ?Medication List  ?  ? ?TAKE these medications   ? ?acetaminophen 500 MG tablet ?Commonly known as: TYLENOL ?Take 500 mg by mouth every 6 (six) hours as needed for moderate pain. ?  ?calcium carbonate 500 MG chewable tablet ?Commonly known as: Tums ?Chew 2 tablets (400 mg of elemental calcium total) by mouth 2 (two) times daily. ?  ?ibuprofen 200 MG tablet ?Commonly known as: ADVIL ?Take 400 mg by mouth 3 (three) times daily as needed for moderate pain. ?  ?levothyroxine 125 MCG tablet ?Commonly known as: Synthroid ?Take 1 tablet (125 mcg total) by mouth daily before breakfast. ?  ?lisinopril-hydrochlorothiazide 20-12.5 MG tablet ?Commonly known as: ZESTORETIC ?Take 1 tablet by mouth daily. ?  ?oxyCODONE 5 MG immediate release tablet ?Commonly known as: Oxy IR/ROXICODONE ?Take 1-2 tablets (5-10 mg total) by mouth every 4 (four) hours as needed for moderate pain. ?  ?traMADol 50 MG  tablet ?Commonly known as: ULTRAM ?Take 50 mg by mouth every 6 (six) hours as needed for moderate pain. ?  ? ?  ? ? Follow-up Information   ? ? 04/04/2021, MD. Schedule an appointment as soon as possible for a visit in 3 week(s).   ?Specialty: General Surgery ?Why: For wound re-check ?Contact information: ?[address] ?[address] ?[place] ?[phone] ? ? ?  ?  ? ?  ?  ? ?  ? ? ?[phone], MD ?[facility] ?Office: [phone] ? ? ?Signed: ?[phone] ?04/02/2021, 8:08 AM ?  ? ?

## 2021-04-02 NOTE — Progress Notes (Signed)
DC packet provided to pt. Instructions provided to pt. Family at bedside, all questions answered. Pt verbalized understanding of instructions. IV removed. Pt take downstairs by staff member.  ?

## 2021-04-02 NOTE — Discharge Instructions (Signed)
[facility] - Dr. Lisaann Atha  THYROID & PARATHYROID SURGERY:  POST-OP INSTRUCTIONS  Always review the instruction sheet provided by the hospital nurse at discharge.  A prescription for pain medication may be sent to your pharmacy at the time of discharge.  Take your pain medication as prescribed.  If narcotic pain medicine is not needed, then you may take acetaminophen (Tylenol) or ibuprofen (Advil) as needed for pain or soreness.  Take your normal home medications as prescribed unless otherwise directed.  If you need a refill on your pain medication, please contact the office during regular business hours.  Prescriptions will not be processed by the office after 5:00PM or on weekends.  Start with a light diet upon arrival home, such as soup and crackers or toast.  Be sure to drink plenty of fluids.  Resume your normal diet the day after surgery.  Most patients will experience some swelling and bruising on the chest and neck area.  Ice packs will help for the first 48 hours after arriving home.  Swelling and bruising will take several days to resolve.   It is common to experience some constipation after surgery.  Increasing fluid intake and taking a stool softener (Colace) will usually help to prevent this problem.  A mild laxative (Milk of Magnesia or Miralax) should be taken according to package directions if there has been no bowel movement after 48 hours.  Dermabond glue covers your incision. This seals the wound and you may shower at any time. The Dermabond will remain in place for about a week.  You may gradually remove the glue when it loosens around the edges.  If you need to loosen the Dermabond for removal, apply a layer of Vaseline to the wound for 15 minutes and then remove with a Kleenex. Your sutures are under the skin and will not show - they will dissolve on their own.  You may resume light daily activities beginning the day after discharge (such as self-care,  walking, climbing stairs), gradually increasing activities as tolerated. You may have sexual intercourse when it is comfortable. Refrain from any heavy lifting or straining until approved by your doctor. You may drive when you no longer are taking prescription pain medication, you can comfortably wear a seatbelt, and you can safely maneuver your car and apply the brakes.  You will see your doctor in the office for a follow-up appointment approximately three weeks after your surgery.  Make sure that you call for this appointment within a day or two after you arrive home to insure a convenient appointment time. Please have any requested laboratory tests performed a few days prior to your office visit so that the results will be available at your follow up appointment.  WHEN TO CALL THE [facility] OFFICE: -- Fever greater than 101.5 -- Inability to urinate -- Nausea and/or vomiting - persistent -- Extreme swelling or bruising -- Continued bleeding from incision -- Increased pain, redness, or drainage from the incision -- Difficulty swallowing or breathing -- Muscle cramping or spasms -- Numbness or tingling in hands or around lips  The clinic staff is available to answer your questions during regular business hours.  Please don't hesitate to call and ask to speak to one of the nurses if you have concerns.  [facility] OFFICE: [phone] (24 hours)  Please sign up for MyChart accounts. This will allow you to communicate directly with my nurse or myself without having to call the office. It will also allow you   to view your test results. You will need to enroll in MyChart for my office ([facility]) and for the hospital ([place]).  Kammi Hechler, MD [facility] A [facility] practice 

## 2021-04-02 NOTE — Progress Notes (Signed)
Transition of Care (TOC) Screening Note ? ?Patient Details  ?Name: Orpah Melter, MD ?Date of Birth: 03-Feb-1970 ? ?Transition of Care (TOC) CM/SW Contact:    ?Sherie Don, LCSW ?Phone Number: ?04/02/2021, 8:57 AM ? ?Transition of Care Department [facility]) has reviewed patient and no TOC needs have been identified at this time. We will continue to monitor patient advancement through interdisciplinary progression rounds. If new patient transition needs arise, please place a TOC consult. ?

## 2021-04-05 LAB — SURGICAL PATHOLOGY

## 2021-04-05 NOTE — Progress Notes (Signed)
Path is benign as expected.  Good news. ? ?Darnell Level, MD ?[facility] ?A [facility] practice ?Office: [phone] ? ?

## 2023-12-29 ENCOUNTER — Encounter: Payer: Self-pay | Admitting: Cardiology

## 2023-12-29 ENCOUNTER — Ambulatory Visit: Attending: Cardiology | Admitting: Cardiology

## 2023-12-29 VITALS — BP 134/87 | HR 85 | Resp 17 | Ht 77.0 in | Wt 265.0 lb

## 2023-12-29 DIAGNOSIS — E7800 Pure hypercholesterolemia, unspecified: Secondary | ICD-10-CM

## 2023-12-29 DIAGNOSIS — R739 Hyperglycemia, unspecified: Secondary | ICD-10-CM

## 2023-12-29 DIAGNOSIS — I10 Essential (primary) hypertension: Secondary | ICD-10-CM

## 2023-12-29 DIAGNOSIS — R931 Abnormal findings on diagnostic imaging of heart and coronary circulation: Secondary | ICD-10-CM | POA: Diagnosis not present

## 2023-12-29 MED ORDER — OLMESARTAN MEDOXOMIL-HCTZ 40-12.5 MG PO TABS: 1.0000 | ORAL_TABLET | ORAL | 2 refills | Status: AC

## 2023-12-29 NOTE — Progress Notes (Unsigned)
 Cardiology Office Note:  .   Date:  12/30/2023  ID:  Jermaine Newcomer, Jermaine Weber, DOB Dec 22, 1970, MRN [id number] PCP: Valma Carwin, Jermaine Weber  [facility] Providers Cardiologist:  Gordy Bergamo, Jermaine Weber   History of Present Illness: .   Jermaine Newcomer, Jermaine Weber is a 53 y.o. Caucasian male patient, an internist with hypertension, hyperglycemia and mild hypercholesterolemia referred to me for evaluation of abnormal coronary calcium  score performed on 09/12/2023 with a total score of 34 in the 56th percentile.  He is asymptomatic.    Discussed the use of AI scribe software for clinical note transcription with the patient, who gave verbal consent to proceed.  History of Present Illness Jermaine Newcomer, Jermaine Weber is a 53 year old male with hypertension and hyperglycemia who presents for cardiovascular evaluation due to an elevated coronary calcium  score. He was referred by Dr. Carwin Coy for evaluation of his coronary calcium  score.  He has a coronary calcium  score of 34 in the LAD and circumflex arteries, corresponding to the 25th to 50th percentile.  He has hypertension on amlodipine 5 mg and lisinopril  HCT 20/12.5 mg, which he splits in half due to lightheadedness. Home blood pressures are usually about 140/90 mmHg, with diastolic often 90 mmHg. 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  He has hyperglycemia and is unsure of his most recent A1c. He has lost 20 pounds by cutting sugary drinks and reducing sugar in alcoholic beverages and aims to lose another 20 pounds.  He walks briskly 1 to 2 miles a few days per week. Knee pain limits running or jogging.  Current medications are amlodipine 5 mg, lisinopril  HCT 20/12.5 mg (half tablet), Synthroid  188 mcg daily (as 100 mcg and 88 mcg tablets). He rarely uses Advil.  His BMI is 31.42. He notes recent weight gain related to a move and temporary limited access to a kitchen, which worsened his diet.  Family history is notable for both parents being on Lipitor  with some tolerability issues. Recent lipids show total cholesterol 197 mg/dL, HDL 49 mg/dL, and LDL 876 mg/dL.  He denies constipation and tolerates amlodipine, though he notes a sense of doughiness that he attributes to aging.  Cardiac Studies relevent.    Coronary calcium  score 09/12/2023: Total calcium  score of 34, LAD 15, LCx 19.  Coronary calcium  score recent 25 to 50th percentile (53 oin the [facility] data base). Noncalcified pulmonary nodule measuring 6 mm.  Labs   No results found for: LIPOA   No results found for: HGBA1C   Care everywhere/Faxed External Labs:  Labs 09/05/2023:  Hb 14.2/HCT 42.3, platelets 267.  Total cholesterol 197, triglycerides 131, HDL 49, LDL 123.  Non-HDL cholesterol 148.  Serum glucose 147 mg, BUN 19, creatinine 1.13.  TSH normal at 1.37  ROS  Review of Systems  Cardiovascular:  Negative for chest pain, dyspnea on exertion and leg swelling.   Physical Exam:   VS:  BP 134/87 (BP Location: Left Arm, Patient Position: Sitting, Cuff Size: Large)   Pulse 85   Resp 17   Ht 6' 5 (1.956 m)   Wt 265 lb (120.2 kg)   SpO2 97%   BMI 31.42 kg/m    Wt Readings from Last 3 Encounters:  12/29/23 265 lb (120.2 kg)  04/01/21 257 lb 11.5 oz (116.9 kg)  03/16/21 221 lb (100.2 kg)    BP Readings from Last 3 Encounters:  12/29/23 134/87  04/02/21 (!) 159/84  03/16/21 (!) 150/86   Physical Exam Constitutional:  Appearance: He is obese.  Neck:     Vascular: No carotid bruit or JVD.  Cardiovascular:     Rate and Rhythm: Normal rate and regular rhythm.     Pulses: Intact distal pulses.     Heart sounds: Normal heart sounds. No murmur heard.    No gallop.  Pulmonary:     Effort: Pulmonary effort is normal.     Breath sounds: Normal breath sounds.  Abdominal:     General: Bowel sounds are normal.     Palpations: Abdomen is soft.  Musculoskeletal:     Right lower leg: No edema.     Left lower leg: No edema.    EKG:    EKG  Interpretation Date/Time:  Friday December 29 2023 08:31:59 EST Ventricular Rate:  83 PR Interval:  176 QRS Duration:  96 QT Interval:  388 QTC Calculation: 455 R Axis:   -10  Text Interpretation: EKG 12/29/2023: Normal sinus rhythm at rate of 83 bpm, normal EKG.  No significant change from 03/16/2021. Confirmed by Kennady Zimmerle, Jagadeesh ([id number]) on 12/29/2023 8:35:59 AM    ASSESSMENT AND PLAN: .      ICD-10-CM   1. Elevated coronary artery calcium  score  09/12/2023: Total calcium  score of 34 in 56th percentile.  R93.1 Apo A1 + B + Ratio    Lipoprotein A (LPA)    EKG 12-Lead    2. Pure hypercholesterolemia  E78.00 Apo A1 + B + Ratio    Lipoprotein A (LPA)    3. Primary hypertension  I10 olmesartan -hydrochlorothiazide  (BENICAR  HCT) 40-12.5 MG tablet    Basic metabolic panel with GFR    4. Hyperglycemia  R73.9 Hgb A1c w/o eAG     Assessment & Plan Elevated coronary artery calcium  score Coronary calcium  score of 34, placing him in the 56th percentile [facility] [place]), indicating age-appropriate progression of coronary atherosclerosis. Not high risk but requires management of risk factors. - Ordered Apo B/A ratio and Lp(a) to assess lipid status - Continue lifestyle modifications to manage risk factors  Primary hypertension Blood pressure not at goal with current regimen of amlodipine 5 mg and lisinopril  HCT 20/12.5 mg (half tablet). Diastolic pressure remains elevated, often around 100 mmHg. Experiencing dizziness, possibly related to diuretic component. - Discontinued lisinopril  HCT - Initiated olmesartan  HCT 40/12.5 mg once daily - Monitor blood pressure and adjust medication as needed, goal blood pressure <130/80 mmHg or less - Ordered BMP to monitor kidney function after medication change  Pure hypercholesterolemia Total cholesterol 197 mg/dL, HDL 49 mg/dL, LDL 876 mg/dL. Decision to initiate lipid-lowering therapy pending further lipid panel results. - Ordered advanced lipid panel  including Apo B/A ratio and Lp(a) - Will consider statin therapy based on lipid panel results - Discussed effects of hyperglycemia with statins.  Hyperglycemia Previous weight loss of 20 pounds. A1c not recently checked. Discussed potential use of Jardiance or Farxiga for weight loss and glycemic control. - Ordered A1c to assess current glycemic control - Will consider Jardiance or Farxiga for weight loss and glycemic control if A1c is elevated  Obesity BMI of 31.42. Recent weight gain due to lifestyle changes. Previous weight loss achieved through dietary changes. - Encouraged continued weight loss efforts - Discussed potential use of Jardiance or Farxiga for weight loss   Follow up: 8 Weeks for hyperchol, Hypertension  Signed,  Gordy Bergamo, Jermaine Weber, [facility] 12/30/2023, 9:03 AM [facility] [address] [place], [place] [address] Phone: [phone]. Fax:  [phone]

## 2023-12-29 NOTE — Patient Instructions (Addendum)
 Medication Instructions:  START OLMESARTAN- hydrochlorothiazide  (BENICAR) 40-12.5mg  *If you need a refill on your cardiac medications before your next appointment, please call your pharmacy*  Lab Work:  Apo A1+B+Ratio, Hgb A1C w/o eAG,  Lipoprotein A (LPA), BMP w/ GFR  If you have labs (blood work) drawn today and your tests are completely normal, you will receive your results only by: MyChart Message (if you have MyChart) OR A paper copy in the mail If you have any lab test that is abnormal or we need to change your treatment, we will call you to review the results.   Follow-Up: At [facility], you and your health needs are our priority.  As part of our continuing mission to provide you with exceptional heart care, our providers are all part of one team.  This team includes your primary Cardiologist (physician) and Advanced Practice Providers or APPs (Physician Assistants and Nurse Practitioners) who all work together to provide you with the care you need, when you need it.  Your next appointment:    6-8 weeks  Provider:   Gordy Bergamo, MD   We recommend signing up for the patient portal called MyChart.  Sign up information is provided on this After Visit Summary.  MyChart is used to connect with patients for Virtual Visits (Telemedicine).  Patients are able to view lab/test results, encounter notes, upcoming appointments, etc.  Non-urgent messages can be sent to your provider as well.   To learn more about what you can do with MyChart, go to [email or web address].

## 2024-01-26 LAB — BASIC METABOLIC PANEL WITH GFR
BUN/Creatinine Ratio: 14 (ref 9–20)
BUN: 17 mg/dL (ref 6–24)
CO2: 23 mmol/L (ref 20–29)
Calcium: 9.4 mg/dL (ref 8.7–10.2)
Chloride: 101 mmol/L (ref 96–106)
Creatinine, Ser: 1.22 mg/dL (ref 0.76–1.27)
Glucose: 123 mg/dL — ABNORMAL HIGH (ref 70–99)
Potassium: 4.9 mmol/L (ref 3.5–5.2)
Sodium: 139 mmol/L (ref 134–144)
eGFR: 70 mL/min/1.73

## 2024-01-29 ENCOUNTER — Ambulatory Visit: Payer: Self-pay | Admitting: Cardiology

## 2024-01-29 DIAGNOSIS — E7800 Pure hypercholesterolemia, unspecified: Secondary | ICD-10-CM

## 2024-01-29 DIAGNOSIS — E119 Type 2 diabetes mellitus without complications: Secondary | ICD-10-CM

## 2024-01-29 LAB — APO A1 + B + RATIO
Apolipo. B/A-1 Ratio: 0.8 ratio — AB (ref 0.0–0.7)
Apolipoprotein A-1: 123 mg/dL (ref 101–178)
Apolipoprotein B: 103 mg/dL — AB

## 2024-01-29 LAB — HGB A1C W/O EAG: Hgb A1c MFr Bld: 6.9 % — ABNORMAL HIGH (ref 4.8–5.6)

## 2024-01-29 LAB — LIPOPROTEIN A (LPA): Lipoprotein (a): 80.1 nmol/L — AB

## 2024-01-29 MED ORDER — EMPAGLIFLOZIN 10 MG PO TABS: 10.0000 mg | ORAL_TABLET | Freq: Every day | ORAL | 2 refills | Status: AC

## 2024-01-29 MED ORDER — ATORVASTATIN CALCIUM 10 MG PO TABS: 10.0000 mg | ORAL_TABLET | Freq: Every day | ORAL | 1 refills | Status: AC

## 2024-01-29 NOTE — Progress Notes (Signed)
 As discussed with Dr. Aida, patient will start him on 10 mg of Jardiance  and also 10 mg of Lipitor and recheck lipids and A1c in 2 months prior to his next office visit in March 2026.    ICD-10-CM   1. Pure hypercholesterolemia  E78.00     2. Type 2 diabetes mellitus without complication, without long-term current use of insulin (HCC)  E11.9      Orders Placed This Encounter  Procedures   Apo A1 + B + Ratio   Lipid Panel With LDL/HDL Ratio   Hemoglobin A1c    Meds ordered this encounter  Medications   empagliflozin  (JARDIANCE ) 10 MG TABS tablet    Sig: Take 1 tablet (10 mg total) by mouth daily.    Dispense:  30 tablet    Refill:  2   atorvastatin  (LIPITOR) 10 MG tablet    Sig: Take 1 tablet (10 mg total) by mouth daily.    Dispense:  90 tablet    Refill:  1

## 2024-02-10 IMAGING — DX DG CHEST 2V
2 series · 2 of 2 positions shown · non-contrast
Comparison: None.

CLINICAL DATA: Presurgical radiograph.

EXAM:
CHEST - 2 VIEW

[chest pa]
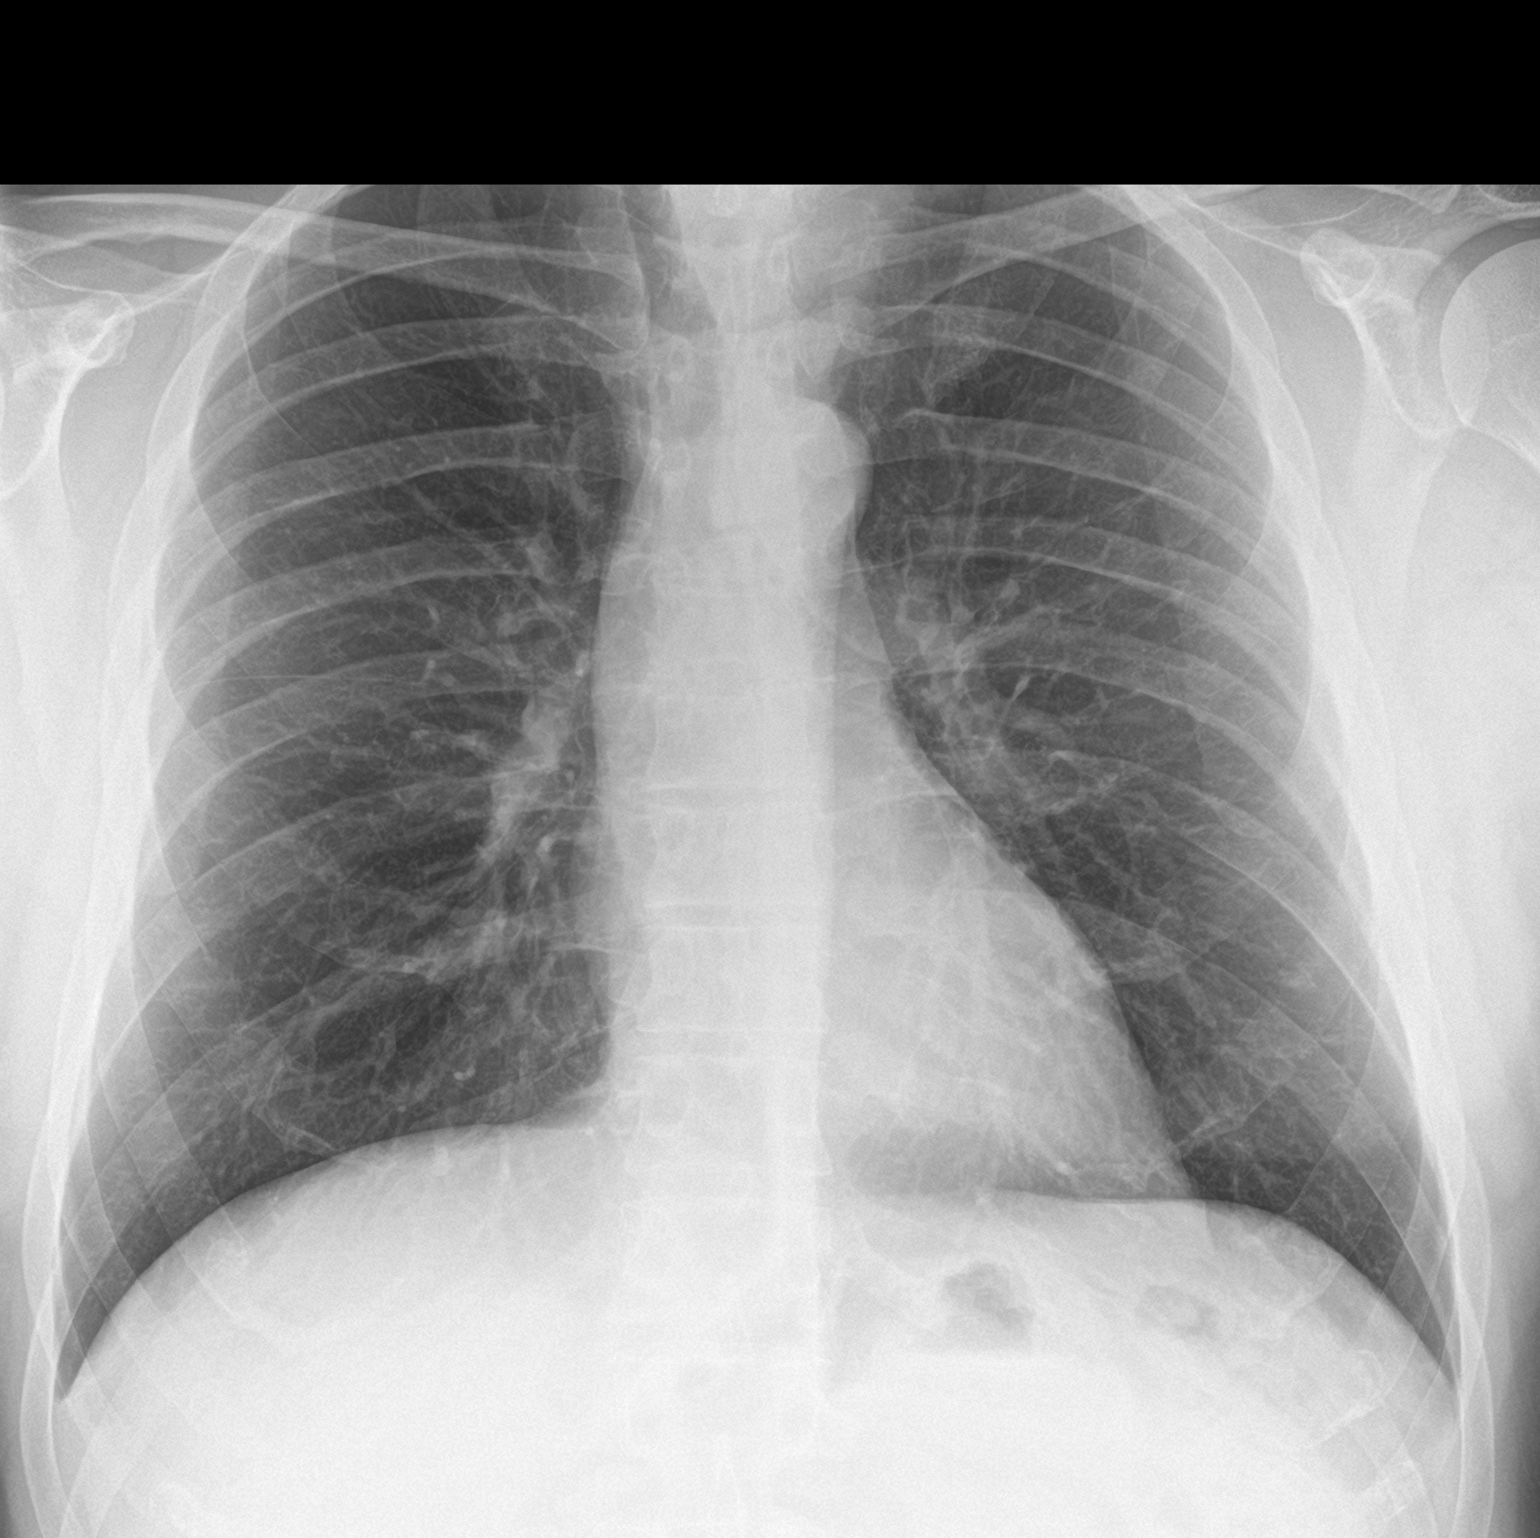

[chest lat]
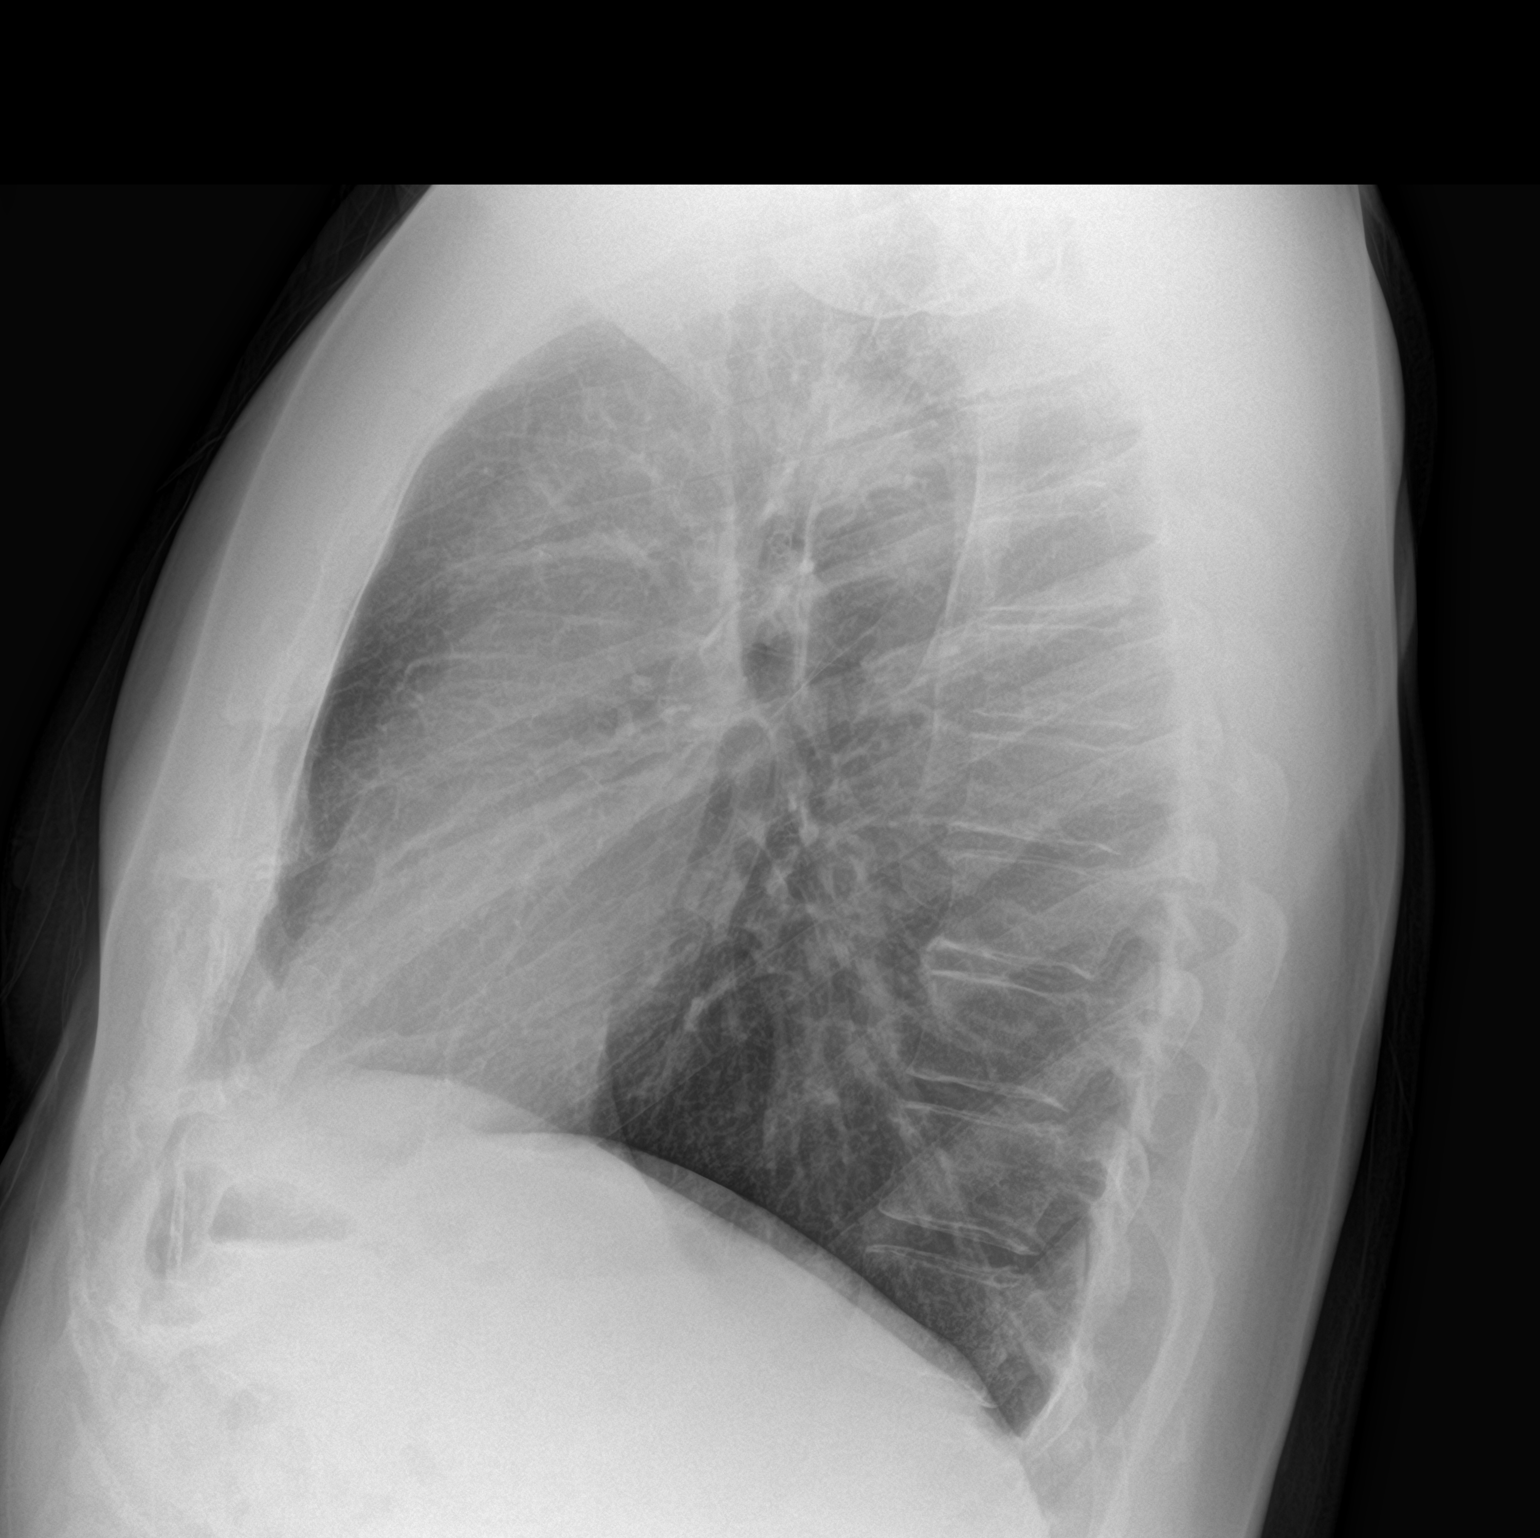

[2 of 2 positions shown; findings below may reference images not displayed]

FINDINGS: Deviation of the aorta to the right, likely related to enlarged
thyroid gland.

Cardiomediastinal silhouette is normal. Mediastinal contours appear
intact.

There is no evidence of focal airspace consolidation, pleural
effusion or pneumothorax.

Osseous structures are without acute abnormality. Soft tissues are
grossly normal.
IMPRESSION: No active cardiopulmonary disease.

Deviation of the aorta to the right.

## 2024-03-29 ENCOUNTER — Ambulatory Visit: Admitting: Cardiology
